# Patient Record
Sex: Male | Born: 1990 | Race: Black or African American | Hispanic: No | Marital: Single | Smoking: Former smoker
Health system: Southern US, Community
[De-identification: ages and names within clinical notes are randomized; demographics above are authoritative.]

## PROBLEM LIST (undated history)

## (undated) DIAGNOSIS — I429 Cardiomyopathy, unspecified: Secondary | ICD-10-CM

## (undated) DIAGNOSIS — N186 End stage renal disease: Secondary | ICD-10-CM

## (undated) DIAGNOSIS — I10 Essential (primary) hypertension: Secondary | ICD-10-CM

## (undated) HISTORY — DX: Essential (primary) hypertension: I10

## (undated) HISTORY — DX: End stage renal disease: N18.6

## (undated) HISTORY — DX: Cardiomyopathy, unspecified: I42.9

---
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
## 2008-03-03 ENCOUNTER — Emergency Department (HOSPITAL_COMMUNITY): Admission: EM | Admit: 2008-03-03 | Discharge: 2008-03-03 | Payer: Self-pay | Admitting: Emergency Medicine

## 2009-11-18 ENCOUNTER — Emergency Department (HOSPITAL_COMMUNITY): Admission: EM | Admit: 2009-11-18 | Discharge: 2009-11-18 | Payer: Self-pay | Admitting: Emergency Medicine

## 2011-01-22 LAB — CBC
HCT: 41.8 % (ref 39.0–52.0)
Hemoglobin: 13.8 g/dL (ref 13.0–17.0)
MCHC: 33 g/dL (ref 30.0–36.0)
MCV: 85.7 fL (ref 78.0–100.0)
Platelets: 144 10*3/uL — ABNORMAL LOW (ref 150–400)
RBC: 4.88 MIL/uL (ref 4.22–5.81)
RDW: 12.9 % (ref 11.5–15.5)
WBC: 10.6 10*3/uL — ABNORMAL HIGH (ref 4.0–10.5)

## 2011-01-22 LAB — DIFFERENTIAL
Basophils Absolute: 0 10*3/uL (ref 0.0–0.1)
Basophils Relative: 0 % (ref 0–1)
Eosinophils Absolute: 0.1 10*3/uL (ref 0.0–0.7)
Eosinophils Relative: 1 % (ref 0–5)
Lymphocytes Relative: 11 % — ABNORMAL LOW (ref 12–46)
Lymphs Abs: 1.2 10*3/uL (ref 0.7–4.0)
Monocytes Absolute: 0.8 10*3/uL (ref 0.1–1.0)
Monocytes Relative: 8 % (ref 3–12)
Neutro Abs: 8.4 10*3/uL — ABNORMAL HIGH (ref 1.7–7.7)
Neutrophils Relative %: 80 % — ABNORMAL HIGH (ref 43–77)

## 2011-01-22 LAB — SEDIMENTATION RATE: Sed Rate: 33 mm/hr — ABNORMAL HIGH (ref 0–16)

## 2018-07-26 ENCOUNTER — Inpatient Hospital Stay (HOSPITAL_COMMUNITY): Payer: Medicaid Other

## 2018-07-26 ENCOUNTER — Emergency Department (HOSPITAL_COMMUNITY): Payer: Medicaid Other

## 2018-07-26 ENCOUNTER — Inpatient Hospital Stay (HOSPITAL_COMMUNITY)
Admission: EM | Admit: 2018-07-26 | Discharge: 2018-08-06 | DRG: 304 | Disposition: A | Discharge status: Home / Self Care | Payer: Medicaid Other | Attending: Internal Medicine | Admitting: Internal Medicine

## 2018-07-26 ENCOUNTER — Encounter (HOSPITAL_COMMUNITY): Payer: Self-pay | Admitting: Emergency Medicine

## 2018-07-26 ENCOUNTER — Other Ambulatory Visit: Payer: Self-pay

## 2018-07-26 DIAGNOSIS — I161 Hypertensive emergency: Secondary | ICD-10-CM | POA: Diagnosis present

## 2018-07-26 DIAGNOSIS — R0602 Shortness of breath: Secondary | ICD-10-CM | POA: Diagnosis present

## 2018-07-26 DIAGNOSIS — R0609 Other forms of dyspnea: Secondary | ICD-10-CM

## 2018-07-26 DIAGNOSIS — N186 End stage renal disease: Secondary | ICD-10-CM

## 2018-07-26 DIAGNOSIS — D631 Anemia in chronic kidney disease: Secondary | ICD-10-CM | POA: Diagnosis present

## 2018-07-26 DIAGNOSIS — E8889 Other specified metabolic disorders: Secondary | ICD-10-CM | POA: Diagnosis present

## 2018-07-26 DIAGNOSIS — R072 Precordial pain: Secondary | ICD-10-CM

## 2018-07-26 DIAGNOSIS — D509 Iron deficiency anemia, unspecified: Secondary | ICD-10-CM | POA: Diagnosis present

## 2018-07-26 DIAGNOSIS — Z992 Dependence on renal dialysis: Secondary | ICD-10-CM

## 2018-07-26 DIAGNOSIS — I5021 Acute systolic (congestive) heart failure: Secondary | ICD-10-CM | POA: Diagnosis present

## 2018-07-26 DIAGNOSIS — I351 Nonrheumatic aortic (valve) insufficiency: Secondary | ICD-10-CM

## 2018-07-26 DIAGNOSIS — J9601 Acute respiratory failure with hypoxia: Secondary | ICD-10-CM | POA: Diagnosis present

## 2018-07-26 DIAGNOSIS — N189 Chronic kidney disease, unspecified: Secondary | ICD-10-CM | POA: Diagnosis present

## 2018-07-26 DIAGNOSIS — F17210 Nicotine dependence, cigarettes, uncomplicated: Secondary | ICD-10-CM | POA: Diagnosis present

## 2018-07-26 DIAGNOSIS — I160 Hypertensive urgency: Secondary | ICD-10-CM | POA: Diagnosis present

## 2018-07-26 DIAGNOSIS — R0600 Dyspnea, unspecified: Secondary | ICD-10-CM | POA: Diagnosis present

## 2018-07-26 DIAGNOSIS — E790 Hyperuricemia without signs of inflammatory arthritis and tophaceous disease: Secondary | ICD-10-CM | POA: Diagnosis present

## 2018-07-26 DIAGNOSIS — I43 Cardiomyopathy in diseases classified elsewhere: Secondary | ICD-10-CM | POA: Diagnosis present

## 2018-07-26 DIAGNOSIS — I340 Nonrheumatic mitral (valve) insufficiency: Secondary | ICD-10-CM

## 2018-07-26 DIAGNOSIS — N179 Acute kidney failure, unspecified: Secondary | ICD-10-CM | POA: Diagnosis present

## 2018-07-26 DIAGNOSIS — I509 Heart failure, unspecified: Secondary | ICD-10-CM

## 2018-07-26 DIAGNOSIS — N281 Cyst of kidney, acquired: Secondary | ICD-10-CM | POA: Diagnosis present

## 2018-07-26 DIAGNOSIS — Z8249 Family history of ischemic heart disease and other diseases of the circulatory system: Secondary | ICD-10-CM

## 2018-07-26 DIAGNOSIS — E876 Hypokalemia: Secondary | ICD-10-CM | POA: Diagnosis present

## 2018-07-26 DIAGNOSIS — N185 Chronic kidney disease, stage 5: Secondary | ICD-10-CM

## 2018-07-26 DIAGNOSIS — Z9114 Patient's other noncompliance with medication regimen: Secondary | ICD-10-CM

## 2018-07-26 DIAGNOSIS — I132 Hypertensive heart and chronic kidney disease with heart failure and with stage 5 chronic kidney disease, or end stage renal disease: Secondary | ICD-10-CM | POA: Diagnosis present

## 2018-07-26 DIAGNOSIS — N19 Unspecified kidney failure: Secondary | ICD-10-CM

## 2018-07-26 DIAGNOSIS — I10 Essential (primary) hypertension: Secondary | ICD-10-CM

## 2018-07-26 LAB — URINALYSIS, ROUTINE W REFLEX MICROSCOPIC
Bilirubin Urine: NEGATIVE
GLUCOSE, UA: NEGATIVE mg/dL
KETONES UR: NEGATIVE mg/dL
LEUKOCYTES UA: NEGATIVE
Nitrite: NEGATIVE
PROTEIN: 100 mg/dL — AB
Specific Gravity, Urine: 1.01 (ref 1.005–1.030)
pH: 5 (ref 5.0–8.0)

## 2018-07-26 LAB — COMPREHENSIVE METABOLIC PANEL
ALBUMIN: 3.8 g/dL (ref 3.5–5.0)
ALT: 30 U/L (ref 0–44)
ANION GAP: 16 — AB (ref 5–15)
AST: 25 U/L (ref 15–41)
Alkaline Phosphatase: 79 U/L (ref 38–126)
BUN: 73 mg/dL — AB (ref 6–20)
CHLORIDE: 92 mmol/L — AB (ref 98–111)
CO2: 28 mmol/L (ref 22–32)
Calcium: 9.2 mg/dL (ref 8.9–10.3)
Creatinine, Ser: 9.55 mg/dL — ABNORMAL HIGH (ref 0.61–1.24)
GFR calc Af Amer: 8 mL/min — ABNORMAL LOW (ref >60)
GFR calc non Af Amer: 7 mL/min — ABNORMAL LOW (ref >60)
Glucose, Bld: 119 mg/dL — ABNORMAL HIGH (ref 70–99)
POTASSIUM: 2.6 mmol/L — AB (ref 3.5–5.1)
SODIUM: 136 mmol/L (ref 135–145)
Total Bilirubin: 1.7 mg/dL — ABNORMAL HIGH (ref 0.3–1.2)
Total Protein: 7.4 g/dL (ref 6.5–8.1)

## 2018-07-26 LAB — RAPID URINE DRUG SCREEN, HOSP PERFORMED
Amphetamines: NOT DETECTED
Barbiturates: NOT DETECTED
Benzodiazepines: NOT DETECTED
Cocaine: NOT DETECTED
OPIATES: NOT DETECTED
TETRAHYDROCANNABINOL: POSITIVE — AB

## 2018-07-26 LAB — CBC WITH DIFFERENTIAL/PLATELET
BASOS PCT: 1 %
Basophils Absolute: 0.1 10*3/uL (ref 0.0–0.1)
Eosinophils Absolute: 0.1 10*3/uL (ref 0.0–0.7)
Eosinophils Relative: 1 %
HEMATOCRIT: 33.9 % — AB (ref 39.0–52.0)
HEMOGLOBIN: 11.2 g/dL — AB (ref 13.0–17.0)
LYMPHS ABS: 1.4 10*3/uL (ref 0.7–4.0)
LYMPHS PCT: 11 %
MCH: 28.6 pg (ref 26.0–34.0)
MCHC: 33 g/dL (ref 30.0–36.0)
MCV: 86.7 fL (ref 78.0–100.0)
MONOS PCT: 6 %
Monocytes Absolute: 0.7 10*3/uL (ref 0.1–1.0)
NEUTROS ABS: 10.4 10*3/uL — AB (ref 1.7–7.7)
NEUTROS PCT: 81 %
Platelets: 151 10*3/uL (ref 150–400)
RBC: 3.91 MIL/uL — ABNORMAL LOW (ref 4.22–5.81)
RDW: 16.7 % — ABNORMAL HIGH (ref 11.5–15.5)
WBC: 12.8 10*3/uL — ABNORMAL HIGH (ref 4.0–10.5)

## 2018-07-26 LAB — NA AND K (SODIUM & POTASSIUM), RAND UR
POTASSIUM UR: 23 mmol/L
Sodium, Ur: 19 mmol/L

## 2018-07-26 LAB — CREATININE, URINE, RANDOM: Creatinine, Urine: 130.17 mg/dL

## 2018-07-26 LAB — D-DIMER, QUANTITATIVE: D-Dimer, Quant: 1.41 ug/mL-FEU — ABNORMAL HIGH (ref 0.00–0.50)

## 2018-07-26 LAB — I-STAT TROPONIN, ED: Troponin i, poc: 0.34 ng/mL (ref 0.00–0.08)

## 2018-07-26 LAB — TROPONIN I: Troponin I: 0.25 ng/mL (ref <0.03)

## 2018-07-26 LAB — BRAIN NATRIURETIC PEPTIDE: B Natriuretic Peptide: 2260 pg/mL — ABNORMAL HIGH (ref 0.0–100.0)

## 2018-07-26 LAB — ECHOCARDIOGRAM COMPLETE

## 2018-07-26 MED ORDER — ASPIRIN 81 MG PO CHEW
324.0000 mg | CHEWABLE_TABLET | Freq: Once | ORAL | Status: AC
  Administered 2018-07-26: 324 mg via ORAL

## 2018-07-26 MED ORDER — TRAZODONE HCL 50 MG PO TABS
50.0000 mg | ORAL_TABLET | Freq: Every evening | ORAL | Status: DC | PRN
  Administered 2018-07-26 – 2018-07-28 (×2): 50 mg via ORAL
  Filled 2018-07-26 (×3): qty 1

## 2018-07-26 MED ORDER — LABETALOL HCL 5 MG/ML IV SOLN: 10.0000 mg | INTRAVENOUS | Status: DC | PRN

## 2018-07-26 MED ORDER — ONDANSETRON HCL 4 MG PO TABS: 4.0000 mg | ORAL_TABLET | Freq: Four times a day (QID) | ORAL | Status: DC | PRN

## 2018-07-26 MED ORDER — ASPIRIN 81 MG PO CHEW
CHEWABLE_TABLET | ORAL | Status: AC
  Filled 2018-07-26: qty 4

## 2018-07-26 MED ORDER — FUROSEMIDE 10 MG/ML IJ SOLN
40.0000 mg | Freq: Two times a day (BID) | INTRAMUSCULAR | Status: DC
  Administered 2018-07-26 – 2018-07-27 (×2): 40 mg via INTRAVENOUS
  Filled 2018-07-26 (×2): qty 4

## 2018-07-26 MED ORDER — SODIUM CHLORIDE 0.9% FLUSH: 3.0000 mL | INTRAVENOUS | Status: DC | PRN

## 2018-07-26 MED ORDER — ALBUTEROL SULFATE (2.5 MG/3ML) 0.083% IN NEBU: 2.5000 mg | INHALATION_SOLUTION | RESPIRATORY_TRACT | Status: DC | PRN

## 2018-07-26 MED ORDER — ACETAMINOPHEN 325 MG PO TABS
650.0000 mg | ORAL_TABLET | Freq: Four times a day (QID) | ORAL | Status: DC | PRN
  Administered 2018-07-26 – 2018-07-28 (×4): 650 mg via ORAL
  Filled 2018-07-26 (×6): qty 2

## 2018-07-26 MED ORDER — POTASSIUM CHLORIDE 10 MEQ/100ML IV SOLN
10.0000 meq | INTRAVENOUS | Status: AC
  Administered 2018-07-26 (×4): 10 meq via INTRAVENOUS
  Filled 2018-07-26 (×3): qty 100

## 2018-07-26 MED ORDER — INFLUENZA VAC SPLIT QUAD 0.5 ML IM SUSY
0.5000 mL | PREFILLED_SYRINGE | INTRAMUSCULAR | Status: DC
  Filled 2018-07-26 (×2): qty 0.5

## 2018-07-26 MED ORDER — SODIUM CHLORIDE 0.9 % IV SOLN: 250.0000 mL | INTRAVENOUS | Status: DC | PRN

## 2018-07-26 MED ORDER — AMLODIPINE BESYLATE 5 MG PO TABS
ORAL_TABLET | ORAL | Status: AC
  Filled 2018-07-26: qty 2

## 2018-07-26 MED ORDER — ONDANSETRON HCL 4 MG/2ML IJ SOLN
4.0000 mg | Freq: Four times a day (QID) | INTRAMUSCULAR | Status: DC | PRN
  Administered 2018-07-26 – 2018-07-28 (×2): 4 mg via INTRAVENOUS
  Filled 2018-07-26 (×2): qty 2

## 2018-07-26 MED ORDER — AMLODIPINE BESYLATE 5 MG PO TABS
10.0000 mg | ORAL_TABLET | Freq: Every day | ORAL | Status: DC
  Administered 2018-07-26 – 2018-08-06 (×11): 10 mg via ORAL
  Filled 2018-07-26 (×11): qty 2

## 2018-07-26 MED ORDER — POTASSIUM CHLORIDE CRYS ER 20 MEQ PO TBCR: 40.0000 meq | EXTENDED_RELEASE_TABLET | Freq: Once | ORAL | Status: DC

## 2018-07-26 MED ORDER — LABETALOL HCL 5 MG/ML IV SOLN
10.0000 mg | Freq: Once | INTRAVENOUS | Status: AC
  Administered 2018-07-26: 10 mg via INTRAVENOUS
  Filled 2018-07-26: qty 4

## 2018-07-26 MED ORDER — SODIUM CHLORIDE 0.9% FLUSH
3.0000 mL | Freq: Two times a day (BID) | INTRAVENOUS | Status: DC
  Administered 2018-07-26 – 2018-08-06 (×19): 3 mL via INTRAVENOUS

## 2018-07-26 MED ORDER — ACETAMINOPHEN 650 MG RE SUPP: 650.0000 mg | Freq: Four times a day (QID) | RECTAL | Status: DC | PRN

## 2018-07-26 MED ORDER — TECHNETIUM TO 99M ALBUMIN AGGREGATED
4.0000 | Freq: Once | INTRAVENOUS | Status: AC | PRN
  Administered 2018-07-26: 4.4 via INTRAVENOUS

## 2018-07-26 MED ORDER — LABETALOL HCL 5 MG/ML IV SOLN
10.0000 mg | INTRAVENOUS | Status: DC | PRN
  Administered 2018-07-26 – 2018-07-27 (×2): 10 mg via INTRAVENOUS
  Filled 2018-07-26 (×2): qty 4

## 2018-07-26 MED ORDER — ISOSORBIDE MONONITRATE ER 60 MG PO TB24
30.0000 mg | ORAL_TABLET | Freq: Every day | ORAL | Status: DC
  Administered 2018-07-26: 30 mg via ORAL
  Filled 2018-07-26 (×6): qty 1

## 2018-07-26 MED ORDER — POTASSIUM CHLORIDE IN NACL 20-0.45 MEQ/L-% IV SOLN
INTRAVENOUS | Status: DC
  Administered 2018-07-26 – 2018-07-28 (×5): via INTRAVENOUS
  Filled 2018-07-26 (×8): qty 1000

## 2018-07-26 MED ORDER — HEPARIN SODIUM (PORCINE) 5000 UNIT/ML IJ SOLN
5000.0000 [IU] | Freq: Three times a day (TID) | INTRAMUSCULAR | Status: AC
  Administered 2018-07-26 – 2018-07-29 (×11): 5000 [IU] via SUBCUTANEOUS
  Filled 2018-07-26 (×11): qty 1

## 2018-07-26 MED ORDER — POLYETHYLENE GLYCOL 3350 17 G PO PACK: 17.0000 g | PACK | Freq: Every day | ORAL | Status: DC | PRN

## 2018-07-26 MED ORDER — TECHNETIUM TC 99M DIETHYLENETRIAME-PENTAACETIC ACID
30.0000 | Freq: Once | INTRAVENOUS | Status: AC | PRN
  Administered 2018-07-26: 33 via RESPIRATORY_TRACT

## 2018-07-26 MED ORDER — HYDRALAZINE HCL 25 MG PO TABS
50.0000 mg | ORAL_TABLET | Freq: Three times a day (TID) | ORAL | Status: DC
  Administered 2018-07-26 (×2): 50 mg via ORAL
  Filled 2018-07-26 (×3): qty 2

## 2018-07-26 NOTE — ED Notes (Signed)
Pt was informed that we need a urine sample. Pt states that he can not urinate at this time. 

## 2018-07-26 NOTE — Progress Notes (Signed)
Lab called with critical value Troponin 0.25. Doctor notified awaiting additional orders, if any.

## 2018-07-26 NOTE — ED Notes (Signed)
02 2l [place] applied. sats from 88-94

## 2018-07-26 NOTE — ED Triage Notes (Signed)
Pt states walked about 40 feet today and became sob with sharp pain to epigastric area. Denied any n/v/d/cp or sweating. States did have sharp pain to mid chest yesterday. Nad at present. No complaints at this time per pt.

## 2018-07-26 NOTE — Consult Note (Signed)
Reason for Consult: Renal failure and malignant hypertension Referring Physician: Dr. Meriam Sprague Edward Thomas is an 27 y.o. male.  HPI: He is a patient who has history of hypertension for since 2004 presently came to the emergency room with complaints of chest pain, difficulty breathing and some orthopnea the last couple of days.  Difficulty breathing and exertional dyspnea has been there for some time.  However the chest pain is new.  Patient also has cough but no sputum production.  He states that he has some hemoptysis since yesterday.  Patient denies any nausea or vomiting.  Appetite is good and no diarrhea.  Patient states that he has been on antihypertensive medication for a short.  However since he did not get any refill he was not taking any medication the last couple of years.  Patient denies any previous history of renal failure or kidney stone.  Patient also denies any use of NSAIDS  Past Medical History:  Diagnosis Date  . Heart problem    pt does not remember, was in high school    History reviewed. No pertinent surgical history.  History reviewed. No pertinent family history.  Social History:  reports that he has been smoking. He has never used smokeless tobacco. He reports that he does not drink alcohol or use drugs.  Allergies: No Known Allergies  Medications: I have reviewed the patient's current medications.  Results for orders placed or performed during the hospital encounter of 07/26/18 (from the past 48 hour(s))  Comprehensive metabolic panel     Status: Abnormal   Collection Time: 07/26/18  9:58 AM  Result Value Ref Range   Sodium 136 135 - 145 mmol/L   Potassium 2.6 (LL) 3.5 - 5.1 mmol/L    Comment: CRITICAL RESULT CALLED TO, READ BACK BY AND VERIFIED WITH: EDWARDS,C AT 10:30AM ON 07/26/18 BY FESTERMAN,C    Chloride 92 (L) 98 - 111 mmol/L   CO2 28 22 - 32 mmol/L   Glucose, Bld 119 (H) 70 - 99 mg/dL   BUN 73 (H) 6 - 20 mg/dL   Creatinine, Ser 9.55 (H) 0.61 -  1.24 mg/dL   Calcium 9.2 8.9 - 10.3 mg/dL   Total Protein 7.4 6.5 - 8.1 g/dL   Albumin 3.8 3.5 - 5.0 g/dL   AST 25 15 - 41 U/L   ALT 30 0 - 44 U/L    Comment: RESULTS CONFIRMED BY MANUAL DILUTION   Alkaline Phosphatase 79 38 - 126 U/L   Total Bilirubin 1.7 (H) 0.3 - 1.2 mg/dL   GFR calc non Af Amer 7 (L) >60 mL/min   GFR calc Af Amer 8 (L) >60 mL/min    Comment: (NOTE) The eGFR has been calculated using the CKD EPI equation. This calculation has not been validated in all clinical situations. eGFR's persistently <60 mL/min signify possible Chronic Kidney Disease.    Anion gap 16 (H) 5 - 15    Comment: Performed at [facility], [address] [place], [place] [address]  Brain natriuretic peptide     Status: Abnormal   Collection Time: 07/26/18  9:58 AM  Result Value Ref Range   B Natriuretic Peptide 2,260.0 (H) 0.0 - 100.0 pg/mL    Comment: Performed at [facility], [address], [place], [place] [address]  CBC with Differential     Status: Abnormal   Collection Time: 07/26/18  9:58 AM  Result Value Ref Range   WBC 12.8 (H) 4.0 - 10.5 K/uL   RBC  3.91 (L) 4.22 - 5.81 MIL/uL   Hemoglobin 11.2 (L) 13.0 - 17.0 g/dL   HCT 33.9 (L) 39.0 - 52.0 %   MCV 86.7 78.0 - 100.0 fL   MCH 28.6 26.0 - 34.0 pg   MCHC 33.0 30.0 - 36.0 g/dL   RDW 16.7 (H) 11.5 - 15.5 %   Platelets 151 150 - 400 K/uL    Comment: SPECIMEN CHECKED FOR CLOTS PLATELET COUNT CONFIRMED BY SMEAR    Neutrophils Relative % 81 %   Neutro Abs 10.4 (H) 1.7 - 7.7 K/uL   Lymphocytes Relative 11 %   Lymphs Abs 1.4 0.7 - 4.0 K/uL   Monocytes Relative 6 %   Monocytes Absolute 0.7 0.1 - 1.0 K/uL   Eosinophils Relative 1 %   Eosinophils Absolute 0.1 0.0 - 0.7 K/uL   Basophils Relative 1 %   Basophils Absolute 0.1 0.0 - 0.1 K/uL    Comment: Performed at [facility], [address] [place], [place] [address]  D-dimer, quantitative     Status: Abnormal   Collection Time: 07/26/18  9:58 AM  Result Value Ref Range    D-Dimer, Quant 1.41 (H) 0.00 - 0.50 ug/mL-FEU    Comment: (NOTE) At the manufacturer cut-off of 0.50 ug/mL FEU, this assay has been documented to exclude PE with a sensitivity and negative predictive value of 97 to 99%.  At this time, this assay has not been approved by the FDA to exclude DVT/VTE. Results should be correlated with clinical presentation. Performed at [facility], [address], [place],  [address]   I-stat troponin, ED     Status: Abnormal   Collection Time: 07/26/18 10:00 AM  Result Value Ref Range   Troponin i, poc 0.34 (HH) 0.00 - 0.08 ng/mL   Comment 3            Comment: Due to the release kinetics of cTnI, a negative result within the first hours of the onset of symptoms does not rule out myocardial infarction with certainty. If myocardial infarction is still suspected, repeat the test at appropriate intervals.     Dg Chest 2 View  Result Date: 07/26/2018 CLINICAL DATA:  Shortness of breath, hemoptysis. EXAM: CHEST - 2 VIEW COMPARISON:  None. FINDINGS: Mild cardiomegaly is noted with central pulmonary vascular congestion. Probable mild bilateral pulmonary edema is noted. No pneumothorax or pleural effusion is noted. Bony thorax is unremarkable. IMPRESSION: Mild cardiomegaly with central pulmonary vascular congestion and probable mild bilateral pulmonary edema. Electronically Signed   By: Marijo Conception, M.D.   On: 07/26/2018 10:11    Review of Systems  Constitutional: Negative for chills, fever and malaise/fatigue.  Respiratory: Positive for cough, hemoptysis and shortness of breath.   Cardiovascular: Positive for chest pain. Negative for orthopnea and leg swelling.       Exertional dyspnea  Gastrointestinal: Negative for diarrhea, nausea and vomiting.  Genitourinary: Positive for frequency.       Nocturia  Musculoskeletal: Negative for myalgias.  Neurological: Negative for dizziness and headaches.  Endo/Heme/Allergies: Negative for polydipsia.    Blood pressure (!) 203/142, pulse (!) 103, resp. rate (!) 36, SpO2 95 %. Physical Exam  Constitutional: He is oriented to person, place, and time. No distress.  Neck: No JVD present.  Cardiovascular: Normal rate and regular rhythm.  Respiratory: No respiratory distress. He has no wheezes. He has no rales.  GI: He exhibits no distension. There is no tenderness.  Musculoskeletal: He exhibits no edema.  Neurological: He  is alert and oriented to person, place, and time.    Assessment/Plan: 1] renal failure: Acute versus chronic.  Presently his creatinine is significantly elevated but patient seems to be asymptomatic.  Etiology could be secondary to malignant hypertension however other primary and secondary cause of renal failure at this moment cannot be ruled out.  Patient also is not able to give any urine hence urinalysis is not available. 2] malignant hypertension: Patient with history of hypertension for more than 15 years and was taking hydrochlorothiazide at one time.  But the patient was not taking any antihypertensive medication for the last couple of years.  Etiology could be primary hypertension however with the presence of hypokalemia and possibly metabolic alkalosis [CO2 is 28] secondary hypertension such as primary hyperaldosteronism cannot be ruled out 3] difficulty breathing and some exertional dyspnea: This could be secondary to flash pulmonary edema related to his high blood pressure.  Patient with some cardiomegaly but no significant sign of fluid overload.  Patient also does not have any edema. 4] hypokalemia: Etiology at this moment not clear.  Previously patient was on hydrochlorothiazide but he states that he is not taking any medication for the last couple of years.  With elevated CO2 need to rule out malignant hypertension. 5] anemia 6] cough: Nonproductive.  Patient not febrile.  His white blood cell count is slightly high Plan: We will check urine sodium, potassium,  creatinine 2] we will check ANA, complement, hepatitis B surface antigen, hepatitis C antibody 3] we will check urine protein to creatinine ratio 4] we will start patient on half-normal saline with 40 mEq of KCl at 75 cc/h 5] we will use Lasix to improve his urine output 6] we will do ultrasound of the kidneys 7] we will check his renal panel in the morning.   Genie Wenke S 07/26/2018, 11:58 AM

## 2018-07-26 NOTE — Progress Notes (Signed)
Patient had oxygen off stated to people in room he would place back on when he went to bed. Stated he had it off all day!

## 2018-07-26 NOTE — H&P (Signed)
Patient Demographics:    Edward Thomas, is a 27 y.o. male  MRN: [id number]   DOB - 04-12-1991  Admit Date - 07/26/2018  Outpatient Primary MD for the patient is Patient, No Pcp Per   Assessment & Plan:    Principal Problem:   Hypertensive urgency Active Problems:   Dyspnea   Acute CHF (congestive heart failure) ([place])   Acute kidney injury superimposed on CKD [facility])    1)Hypertensive Emergency--- on presentation to the ED blood pressure was 251/154,  patient was in heart failure, renal failure as well as respiratory failure with tachypnea and hypoxia, despite IV medications blood pressure remained elevated, patient admits to noncompliance with antihypertensives for long time, empirically start amlodipine 10 mg daily, hydralazine 50 mg 3 times daily,/isosorbide 30 mg daily,  may use IV labetalol when necessary  Every 4 hours for systolic blood pressure over 160 mmhg   2) acute congestive heart failure--- secondary to uncontrolled hypertension, ???  Diastolic versus Systolic, BNP was 4356, chest x-ray consistent with pulmonary edema, clinical exam consistent with heart failure, echo pending, IV diuretics as ordered Daily weight and fluid input and output monitoring, serial troponins and EKGs for ACS rule out, replace potassium, continue supplemental oxygen, VQ scan low probability for PE  3)AKI----acute kidney injury on CKD stage - ??? Baseline CKD stage    creatinine on admission= 9.55 with a BUN of 73,   ,   baseline creatinine = ??? Baseline    ,  renally adjust medications, avoid nephrotoxic agents/dehydration/hypotension, prior renal parameters not available, ultrasound suggest chronic kidney disease secondary to medical renal etiology without obstructive uropathy, suspect due to uncontrolled hypertension,  nephrology consult appreciated, further work-up including hepatitis profile, UA, complement series, ANA, ANCA and antistreptolysin titers are pending  4) acute hypoxic respiratory failure--- secondary to #1 and #2 above, give supplemental oxygen  5)Tobacco Abuse-  Smoking cessation counseling for 4 minutes today, consider nicotine patch   With History of - Reviewed by me  Past Medical History:  Diagnosis Date  . Heart problem    pt does not remember, was in high school      History reviewed. No pertinent surgical history.    Chief Complaint  Patient presents with  . Shortness of Breath      HPI:    Edward Thomas  is a 27 y.o. male with past medical history relevant for long-standing history of hypertension diagnosed as a teenager (2008) patient has not been compliant with antihypertensives for more than 8 years now presents with progressive dyspnea on exertion, orthopnea, paroxysmal nocturnal dyspnea over the last several days got worse over the last 24 hours.  No frank chest pain at this time, however patient admits to intermittent chest discomfort over the last week  No vomiting no diarrhea, no sick contacts at home,  Denies illicit drug use but admits to tobacco use denies excessive alcohol intake  In ED...  blood pressure  was 251/154, d-dimer was elevated but VQ scan was low for ability for PE  Clinical exam in the ED and chest x-ray as well as BMP were consistent with acute CHF,  It was found to have renal dysfunction with a creatinine of 9.5 and a BUN of 73, renal ultrasound failed to demonstrate obstructive uropathy it does show chronic medical renal disease most likely due to long-term uncontrolled hypertension  Patient received IV labetalol as well as Lasix in the ED with some improvement in BP and shortness of breath   Review of systems:    In addition to the HPI above,   A full Review of  Systems was done, all other systems reviewed are negative except as  noted above in HPI , .    Social History:  Reviewed by me    Social History   Tobacco Use  . Smoking status: Current Every Day Smoker  . Smokeless tobacco: Never Used  Substance Use Topics  . Alcohol use: Never    Frequency: Never     Family History :  Reviewed by me HTN   Home Medications:   Prior to Admission medications   Not on File     Allergies:    No Known Allergies   Physical Exam:   Vitals  Blood pressure (!) 173/120, pulse 99, resp. rate (!) 25, SpO2 (!) 89 %.  Physical Examination: General appearance - alert, well appearing, and in no distress and  Mental status - alert, oriented to person, place, and time,  Nose- [place] 2L/min Eyes - sclera anicteric Neck - supple, +ve JVD elevation , Chest -diminished in bases with bibasilar rales  heart - S1 and S2 normal, regular Abdomen - soft, nontender, nondistended, no masses or organomegaly, no CVA tenderness Neurological - screening mental status exam normal, neck supple without rigidity, cranial nerves II through XII intact, DTR's normal and symmetric Extremities - trace pedal edema noted, intact peripheral pulses  Skin - warm, dry     Data Review:    CBC Recent Labs  Lab 07/26/18 0958  WBC 12.8*  HGB 11.2*  HCT 33.9*  PLT 151  MCV 86.7  MCH 28.6  MCHC 33.0  RDW 16.7*  LYMPHSABS 1.4  MONOABS 0.7  EOSABS 0.1  BASOSABS 0.1   ------------------------------------------------------------------------------------------------------------------  Chemistries  Recent Labs  Lab 07/26/18 0958  NA 136  K 2.6*  CL 92*  CO2 28  GLUCOSE 119*  BUN 73*  CREATININE 9.55*  CALCIUM 9.2  AST 25  ALT 30  ALKPHOS 79  BILITOT 1.7*   ------------------------------------------------------------------------------------------------------------------ CrCl cannot be calculated (Unknown ideal  weight.). ------------------------------------------------------------------------------------------------------------------ No results for input(s): TSH, T4TOTAL, T3FREE, THYROIDAB in the last 72 hours.  Invalid input(s): FREET3   Coagulation profile No results for input(s): INR, PROTIME in the last 168 hours. ------------------------------------------------------------------------------------------------------------------- Recent Labs    07/26/18 0958  DDIMER 1.41*   -------------------------------------------------------------------------------------------------------------------  Cardiac Enzymes No results for input(s): CKMB, TROPONINI, MYOGLOBIN in the last 168 hours.  Invalid input(s): CK ------------------------------------------------------------------------------------------------------------------    Component Value Date/Time   BNP 2,260.0 (H) 07/26/2018 0958   ---------------------------------------------------------------------------------------------------------------  Urinalysis No results found for: COLORURINE, APPEARANCEUR, LABSPEC, PHURINE, GLUCOSEU, HGBUR, BILIRUBINUR, KETONESUR, PROTEINUR, UROBILINOGEN, NITRITE, LEUKOCYTESUR  ----------------------------------------------------------------------------------------------------------------   Imaging Results:    Dg Chest 2 View  Result Date: 07/26/2018 CLINICAL DATA:  Shortness of breath, hemoptysis. EXAM: CHEST - 2 VIEW COMPARISON:  None. FINDINGS: Mild cardiomegaly is noted with central pulmonary vascular congestion. Probable mild bilateral pulmonary edema is noted.  No pneumothorax or pleural effusion is noted. Bony thorax is unremarkable. IMPRESSION: Mild cardiomegaly with central pulmonary vascular congestion and probable mild bilateral pulmonary edema. Electronically Signed   By: Marijo Conception, M.D.   On: 07/26/2018 10:11   US Renal  Result Date: 07/26/2018 CLINICAL DATA:  Renal failure. EXAM: RENAL /  URINARY TRACT ULTRASOUND COMPLETE COMPARISON:  07/26/2018. FINDINGS: Right Kidney: Length: 10.9 cm. Increased echogenicity. 1.7 cm hyperechoic shadowing density noted the midportion of the right kidney. This could be a calcified renal mass. Large renal stone could also present this fashion. No hydronephrosis visualized. Left Kidney: Length: 9.4 cm. Increased echogenicity. 1.9 cm complex cystic versus solid mass noted the midportion of the left kidney. No hydronephrosis visualized. Bladder: Appears normal for degree of bladder distention. IMPRESSION: 1. Increased echogenicity both kidneys consistent with chronic medical renal disease. 2. 1.7 cm hyperechoic shadowing density in the midportion of the right kidney. This could represent a calcified renal mass. Large renal stone could present in this fashion. 1.9 cm complex cystic versus solid mass in the midportion of the left kidney. A renal tumor in either kidney cannot be excluded. For further evaluation MRI of the kidneys suggested. Electronically Signed   By: Marcello Moores  Register   On: 07/26/2018 13:57   Nm Pulmonary Vent And Perf (v/q Scan)  Result Date: 07/26/2018 CLINICAL DATA:  Shortness of Breath EXAM: NUCLEAR MEDICINE VENTILATION - PERFUSION LUNG SCAN VIEWS: Anterior, posterior, left lateral, right lateral, RPO, LPO, RAO, LAO-ventilation and perfusion RADIOPHARMACEUTICALS:  33.0 mCi of Tc-1m DTPA aerosol inhalation and 4.4 mCi Tc48m-MAA IV COMPARISON:  Chest radiograph July 26, 2018 FINDINGS: Ventilation: Radiotracer uptake is homogeneous and symmetric bilaterally. No ventilation defects are evident. Perfusion: Radiotracer uptake is homogeneous and symmetric bilaterally. No perfusion defects are evident. IMPRESSION: No ventilation or perfusion defects. Very low probability of pulmonary embolus. Electronically Signed   By: Lowella Grip III M.D.   On: 07/26/2018 13:33    Radiological Exams on Admission: Dg Chest 2 View  Result Date:  07/26/2018 CLINICAL DATA:  Shortness of breath, hemoptysis. EXAM: CHEST - 2 VIEW COMPARISON:  None. FINDINGS: Mild cardiomegaly is noted with central pulmonary vascular congestion. Probable mild bilateral pulmonary edema is noted. No pneumothorax or pleural effusion is noted. Bony thorax is unremarkable. IMPRESSION: Mild cardiomegaly with central pulmonary vascular congestion and probable mild bilateral pulmonary edema. Electronically Signed   By: Marijo Conception, M.D.   On: 07/26/2018 10:11   US Renal  Result Date: 07/26/2018 CLINICAL DATA:  Renal failure. EXAM: RENAL / URINARY TRACT ULTRASOUND COMPLETE COMPARISON:  07/26/2018. FINDINGS: Right Kidney: Length: 10.9 cm. Increased echogenicity. 1.7 cm hyperechoic shadowing density noted the midportion of the right kidney. This could be a calcified renal mass. Large renal stone could also present this fashion. No hydronephrosis visualized. Left Kidney: Length: 9.4 cm. Increased echogenicity. 1.9 cm complex cystic versus solid mass noted the midportion of the left kidney. No hydronephrosis visualized. Bladder: Appears normal for degree of bladder distention. IMPRESSION: 1. Increased echogenicity both kidneys consistent with chronic medical renal disease. 2. 1.7 cm hyperechoic shadowing density in the midportion of the right kidney. This could represent a calcified renal mass. Large renal stone could present in this fashion. 1.9 cm complex cystic versus solid mass in the midportion of the left kidney. A renal tumor in either kidney cannot be excluded. For further evaluation MRI of the kidneys suggested. Electronically Signed   By: Marcello Moores  Register   On: 07/26/2018 13:57  Nm Pulmonary Vent And Perf (v/q Scan)  Result Date: 07/26/2018 CLINICAL DATA:  Shortness of Breath EXAM: NUCLEAR MEDICINE VENTILATION - PERFUSION LUNG SCAN VIEWS: Anterior, posterior, left lateral, right lateral, RPO, LPO, RAO, LAO-ventilation and perfusion RADIOPHARMACEUTICALS:  33.0 mCi of  Tc-70m DTPA aerosol inhalation and 4.4 mCi Tc78m-MAA IV COMPARISON:  Chest radiograph July 26, 2018 FINDINGS: Ventilation: Radiotracer uptake is homogeneous and symmetric bilaterally. No ventilation defects are evident. Perfusion: Radiotracer uptake is homogeneous and symmetric bilaterally. No perfusion defects are evident. IMPRESSION: No ventilation or perfusion defects. Very low probability of pulmonary embolus. Electronically Signed   By: Lowella Grip III M.D.   On: 07/26/2018 13:33    DVT Prophylaxis -SCD/Heparin  AM Labs Ordered, also please review Full Orders  Family Communication: Admission, patients condition and plan of care including tests being ordered have been discussed with the patient  who indicate understanding and agree with the plan   Code Status - Full Code  Likely DC to  home  Condition   Stable  Roxan Hockey M.D on 07/26/2018 at 4:50 PM Pager---[phone] Go to [email or web address] - [email or web address] for contact info  [facility] - Office  [phone]

## 2018-07-26 NOTE — ED Notes (Signed)
Pt states he has a smokers cough and has been spitting up blood last 2 days. edp in wit pt now

## 2018-07-26 NOTE — Progress Notes (Signed)
*  PRELIMINARY RESULTS* Echocardiogram 2D Echocardiogram has been performed.  Edward Thomas 07/26/2018, 3:21 PM

## 2018-07-26 NOTE — Progress Notes (Deleted)
Report called to Lorriane Shire on 6E at [facility].  Patient transported by [facility] about 45 minutes ago   c

## 2018-07-26 NOTE — ED Notes (Addendum)
CRITICAL VALUE ALERT  Critical Value:  Potassium 2.6/troponin 0.36/d dimer 1.41  Date & Time Notied:  07/26/18  Provider Notified: dr long  Orders Received/Actions taken:

## 2018-07-26 NOTE — ED Notes (Signed)
Labored breathing noted. Pt denies any sob

## 2018-07-26 NOTE — ED Notes (Signed)
Pts mother's cell phone is [phone] for updates

## 2018-07-26 NOTE — ED Provider Notes (Signed)
Emergency Department Provider Note   I have reviewed the triage vital signs and the nursing notes.   HISTORY  Chief Complaint Shortness of Breath   HPI Edward Thomas is a 27 y.o. male with PMH of HTN off meds since 2008 presents to the emergency department for evaluation of intermittent chest pain and dyspnea worsening over the past 2 days.  Patient does smoke cigarettes but states he quit 2 days ago when symptoms began.  He describes an intermittent, sharp, substernal chest pain with no obvious provoking or modifying factors.  No radiation of pain.  He is not currently having chest pain but is noticed significant dyspnea worsening over the last 2 days.  His dyspnea is worse with exertion and he has had a cough which is nonproductive but sometimes will spit out some bloody sputum.  No history of DVT/PE.  He denies noticing any lower extremity swelling.  Denies abdominal pain or back pain.  No unilateral weakness, numbness, vision changes, or speech changes.   Past Medical History:  Diagnosis Date  . Heart problem    pt does not remember, was in high school    Patient Active Problem List   Diagnosis Date Noted  . Hypertensive urgency 07/26/2018    History reviewed. No pertinent surgical history.    Allergies Patient has no known allergies.  History reviewed. No pertinent family history.  Social History Social History   Tobacco Use  . Smoking status: Current Every Day Smoker  . Smokeless tobacco: Never Used  Substance Use Topics  . Alcohol use: Never    Frequency: Never  . Drug use: Never    Review of Systems  Constitutional: No fever/chills Eyes: No visual changes. ENT: No sore throat. Cardiovascular: Positive chest pain. Respiratory: Positive shortness of breath. Gastrointestinal: No abdominal pain.  No nausea, no vomiting.  No diarrhea.  No constipation. Genitourinary: Negative for dysuria. Musculoskeletal: Negative for back pain. Skin: Negative for  rash. Neurological: Negative for headaches, focal weakness or numbness.  10-point ROS otherwise negative.  ____________________________________________   PHYSICAL EXAM:  VITAL SIGNS: ED Triage Vitals  Enc Vitals Group     BP 07/26/18 0938 (!) 239/169     Pulse Rate 07/26/18 0941 (!) 118     Resp 07/26/18 0941 18     SpO2 07/26/18 0941 94 %     Pain Score 07/26/18 0938 0   Constitutional: Alert and oriented. Well appearing and in no acute distress. Eyes: Conjunctivae are normal. Head: Atraumatic. Nose: No congestion/rhinnorhea. Mouth/Throat: Mucous membranes are moist.  Neck: No stridor.  Cardiovascular: Tachycardia. Good peripheral circulation. Grossly normal heart sounds.   Respiratory: Increased respiratory effort.  No retractions. Lungs CTAB. No wheezing, rales, or rhonchi.  Gastrointestinal: Soft and nontender. No distention.  Musculoskeletal: No lower extremity tenderness nor edema. No gross deformities of extremities. Neurologic:  Normal speech and language. No gross focal neurologic deficits are appreciated.  Skin:  Skin is warm, dry and intact. No rash noted.  ____________________________________________   LABS (all labs ordered are listed, but only abnormal results are displayed)  Labs Reviewed  COMPREHENSIVE METABOLIC PANEL - Abnormal; Notable for the following components:      Result Value   Potassium 2.6 (*)    Chloride 92 (*)    Glucose, Bld 119 (*)    BUN 73 (*)    Creatinine, Ser 9.55 (*)    Total Bilirubin 1.7 (*)    GFR calc non Af Amer 7 (*)  GFR calc Af Amer 8 (*)    Anion gap 16 (*)    All other components within normal limits  BRAIN NATRIURETIC PEPTIDE - Abnormal; Notable for the following components:   B Natriuretic Peptide 2,260.0 (*)    All other components within normal limits  CBC WITH DIFFERENTIAL/PLATELET - Abnormal; Notable for the following components:   WBC 12.8 (*)    RBC 3.91 (*)    Hemoglobin 11.2 (*)    HCT 33.9 (*)    RDW  16.7 (*)    Neutro Abs 10.4 (*)    All other components within normal limits  D-DIMER, QUANTITATIVE (NOT AT [facility]) - Abnormal; Notable for the following components:   D-Dimer, Quant 1.41 (*)    All other components within normal limits  I-STAT TROPONIN, ED - Abnormal; Notable for the following components:   Troponin i, poc 0.34 (*)    All other components within normal limits  URINALYSIS, ROUTINE W REFLEX MICROSCOPIC  RAPID URINE DRUG SCREEN, HOSP PERFORMED  RAPID URINE DRUG SCREEN, HOSP PERFORMED  HIV ANTIBODY (ROUTINE TESTING W REFLEX)   ____________________________________________  EKG   EKG Interpretation  Date/Time:  Friday July 26 2018 09:49:47 EDT Ventricular Rate:  118 PR Interval:    QRS Duration: 105 QT Interval:  376 QTC Calculation: 527 R Axis:   74 Text Interpretation:  Sinus tachycardia Biatrial enlargement LVH with secondary repolarization abnormality Anterior ST elevation, probably due to LVH Prolonged QT interval No STEMI.  Confirmed by Nanda Quinton [phone]) on 07/26/2018 9:52:43 AM       ____________________________________________  RADIOLOGY  Dg Chest 2 View  Result Date: 07/26/2018 CLINICAL DATA:  Shortness of breath, hemoptysis. EXAM: CHEST - 2 VIEW COMPARISON:  None. FINDINGS: Mild cardiomegaly is noted with central pulmonary vascular congestion. Probable mild bilateral pulmonary edema is noted. No pneumothorax or pleural effusion is noted. Bony thorax is unremarkable. IMPRESSION: Mild cardiomegaly with central pulmonary vascular congestion and probable mild bilateral pulmonary edema. Electronically Signed   By: Marijo Conception, M.D.   On: 07/26/2018 10:11    ____________________________________________   PROCEDURES  Procedure(s) performed:   .Critical Care Performed by: Margette Fast, MD Authorized by: Margette Fast, MD   Critical care provider statement:    Critical care time (minutes):  35   Critical care time was exclusive of:   Teaching time and separately billable procedures and treating other patients   Critical care was necessary to treat or prevent imminent or life-threatening deterioration of the following conditions:  Circulatory failure, respiratory failure and cardiac failure   Critical care was time spent personally by me on the following activities:  Blood draw for specimens, development of treatment plan with patient or surrogate, discussions with consultants, evaluation of patient's response to treatment, examination of patient, obtaining history from patient or surrogate, ordering and performing treatments and interventions, ordering and review of laboratory studies, ordering and review of radiographic studies, pulse oximetry, re-evaluation of patient's condition and review of old charts   I assumed direction of critical care for this patient from another provider in my specialty: no       ____________________________________________   INITIAL IMPRESSION / [place] / ED COURSE  Pertinent labs & imaging results that were available during my care of the patient were reviewed by me and considered in my medical decision making (see chart for details).  She presents emergency department with intermittent chest pain and dyspnea.  He has extremely high blood pressure here  along with tachycardia and mild hypoxemia.  Lungs are clear with no wheezing.  He does smoke cigarettes.  Does not appear volume overloaded.  Considering multiple possible diagnoses including PE, hypertensive emergency, ACS. Plan for labetalol PRN for now and labs including CXR and troponin/d-dimer.   11:49 AM Patient with multiple lab abnormalities including renal failure, elevated troponin/d-dimer.  Doubt primary ACS or PE plan for enzyme trending and VQ scan.  Spoke with nephrology, Dr. Lowanda Foster.  She has blood pressure controlled in the emergency department with as needed doses of labetalol.  No infusion required at this time but  will continue to monitor.   Discussed patient's case with Hospitalist, Dr. Denton Brick to request admission. Patient and family (if present) updated with plan. Care transferred to Hospitalist service.  I reviewed all nursing notes, vitals, pertinent old records, EKGs, labs, imaging (as available).  ____________________________________________  FINAL CLINICAL IMPRESSION(S) / ED DIAGNOSES  Final diagnoses:  Hypertensive emergency  Renal failure, unspecified chronicity  Precordial chest pain  Dyspnea on exertion     MEDICATIONS GIVEN DURING THIS VISIT:  Medications  potassium chloride 10 mEq in 100 mL IVPB (10 mEq Intravenous New Bag/Given 07/26/18 1100)  sodium chloride flush (NS) 0.9 % injection 3 mL (has no administration in time range)  sodium chloride flush (NS) 0.9 % injection 3 mL (has no administration in time range)  0.9 %  sodium chloride infusion (has no administration in time range)  acetaminophen (TYLENOL) tablet 650 mg (has no administration in time range)    Or  acetaminophen (TYLENOL) suppository 650 mg (has no administration in time range)  traZODone (DESYREL) tablet 50 mg (has no administration in time range)  polyethylene glycol (MIRALAX / GLYCOLAX) packet 17 g (has no administration in time range)  ondansetron (ZOFRAN) tablet 4 mg (has no administration in time range)    Or  ondansetron (ZOFRAN) injection 4 mg (has no administration in time range)  albuterol (PROVENTIL) (2.5 MG/3ML) 0.083% nebulizer solution 2.5 mg (has no administration in time range)  heparin injection 5,000 Units (has no administration in time range)  furosemide (LASIX) injection 40 mg (has no administration in time range)  potassium chloride SA (K-DUR,KLOR-CON) CR tablet 40 mEq (has no administration in time range)  potassium chloride SA (K-DUR,KLOR-CON) CR tablet 40 mEq (has no administration in time range)  amLODipine (NORVASC) tablet 10 mg (has no administration in time range)  isosorbide  mononitrate (IMDUR) 24 hr tablet 30 mg (has no administration in time range)  hydrALAZINE (APRESOLINE) tablet 50 mg (has no administration in time range)  labetalol (NORMODYNE,TRANDATE) injection 10 mg (has no administration in time range)  labetalol (NORMODYNE,TRANDATE) injection 10 mg (10 mg Intravenous Given 07/26/18 0955)  aspirin chewable tablet 324 mg (324 mg Oral Given 07/26/18 1043)    Note:  This document was prepared using Dragon voice recognition software and may include unintentional dictation errors.  Nanda Quinton, MD Emergency Medicine    Long, [facility], MD 07/26/18 [phone]

## 2018-07-27 LAB — RENAL FUNCTION PANEL
ANION GAP: 13 (ref 5–15)
Albumin: 3.1 g/dL — ABNORMAL LOW (ref 3.5–5.0)
BUN: 71 mg/dL — ABNORMAL HIGH (ref 6–20)
CHLORIDE: 95 mmol/L — AB (ref 98–111)
CO2: 27 mmol/L (ref 22–32)
Calcium: 8.8 mg/dL — ABNORMAL LOW (ref 8.9–10.3)
Creatinine, Ser: 9.39 mg/dL — ABNORMAL HIGH (ref 0.61–1.24)
GFR calc non Af Amer: 7 mL/min — ABNORMAL LOW (ref >60)
GFR, EST AFRICAN AMERICAN: 8 mL/min — AB (ref >60)
Glucose, Bld: 103 mg/dL — ABNORMAL HIGH (ref 70–99)
POTASSIUM: 2.9 mmol/L — AB (ref 3.5–5.1)
Phosphorus: 5.1 mg/dL — ABNORMAL HIGH (ref 2.5–4.6)
Sodium: 135 mmol/L (ref 135–145)

## 2018-07-27 LAB — CBC
HEMATOCRIT: 28.1 % — AB (ref 39.0–52.0)
HEMOGLOBIN: 9.3 g/dL — AB (ref 13.0–17.0)
MCH: 28.8 pg (ref 26.0–34.0)
MCHC: 33.1 g/dL (ref 30.0–36.0)
MCV: 87 fL (ref 78.0–100.0)
Platelets: 130 10*3/uL — ABNORMAL LOW (ref 150–400)
RBC: 3.23 MIL/uL — AB (ref 4.22–5.81)
RDW: 16.7 % — ABNORMAL HIGH (ref 11.5–15.5)
WBC: 9.8 10*3/uL (ref 4.0–10.5)

## 2018-07-27 LAB — HIV ANTIBODY (ROUTINE TESTING W REFLEX): HIV Screen 4th Generation wRfx: NONREACTIVE

## 2018-07-27 LAB — IRON AND TIBC
IRON: 57 ug/dL (ref 45–182)
SATURATION RATIOS: 20 % (ref 17.9–39.5)
TIBC: 288 ug/dL (ref 250–450)
UIBC: 231 ug/dL

## 2018-07-27 LAB — HEPATITIS B SURFACE ANTIGEN: Hepatitis B Surface Ag: NEGATIVE

## 2018-07-27 LAB — FERRITIN: FERRITIN: 121 ng/mL (ref 24–336)

## 2018-07-27 MED ORDER — POTASSIUM CHLORIDE CRYS ER 20 MEQ PO TBCR
40.0000 meq | EXTENDED_RELEASE_TABLET | Freq: Once | ORAL | Status: AC
  Administered 2018-07-27: 40 meq via ORAL
  Filled 2018-07-27: qty 4

## 2018-07-27 MED ORDER — CARVEDILOL 3.125 MG PO TABS
3.1250 mg | ORAL_TABLET | Freq: Two times a day (BID) | ORAL | Status: DC
  Administered 2018-07-28: 3.125 mg via ORAL
  Filled 2018-07-27: qty 1

## 2018-07-27 MED ORDER — POTASSIUM CHLORIDE CRYS ER 20 MEQ PO TBCR: 40.0000 meq | EXTENDED_RELEASE_TABLET | Freq: Once | ORAL | Status: DC

## 2018-07-27 MED ORDER — POTASSIUM CHLORIDE CRYS ER 20 MEQ PO TBCR
40.0000 meq | EXTENDED_RELEASE_TABLET | Freq: Once | ORAL | Status: DC
  Filled 2018-07-27: qty 4

## 2018-07-27 MED ORDER — LABETALOL HCL 200 MG PO TABS
200.0000 mg | ORAL_TABLET | Freq: Two times a day (BID) | ORAL | Status: DC
  Filled 2018-07-27: qty 1

## 2018-07-27 MED ORDER — ISOSORBIDE MONONITRATE ER 60 MG PO TB24
60.0000 mg | ORAL_TABLET | Freq: Every day | ORAL | Status: DC
  Administered 2018-07-27 – 2018-08-06 (×10): 60 mg via ORAL
  Filled 2018-07-27 (×11): qty 1

## 2018-07-27 MED ORDER — HYDRALAZINE HCL 25 MG PO TABS
100.0000 mg | ORAL_TABLET | Freq: Three times a day (TID) | ORAL | Status: DC
  Administered 2018-07-27 – 2018-08-06 (×31): 100 mg via ORAL
  Filled 2018-07-27 (×31): qty 4

## 2018-07-27 MED ORDER — LABETALOL HCL 5 MG/ML IV SOLN: 10.0000 mg | INTRAVENOUS | Status: DC | PRN

## 2018-07-27 MED ORDER — POTASSIUM CHLORIDE CRYS ER 20 MEQ PO TBCR
40.0000 meq | EXTENDED_RELEASE_TABLET | Freq: Once | ORAL | Status: AC
  Administered 2018-07-27: 40 meq via ORAL
  Filled 2018-07-27: qty 2

## 2018-07-27 MED ORDER — LABETALOL HCL 5 MG/ML IV SOLN
20.0000 mg | Freq: Once | INTRAVENOUS | Status: DC
  Filled 2018-07-27 (×2): qty 4

## 2018-07-27 NOTE — Progress Notes (Signed)
Subjective: Interval History: has no complaint of chest pain or difficulty breathing.  Patient is feeling much better.  He denies also any nausea or vomiting..  Objective: Vital signs in last 24 hours: Temp:  [97.8 F (36.6 C)-98.2 F (36.8 C)] 98.2 F (36.8 C) (09/21 0701) Pulse Rate:  [95-120] 109 (09/21 0701) Resp:  [16-36] 16 (09/21 0701) BP: (173-251)/(113-169) 220/130 (09/21 0701) SpO2:  [86 %-98 %] 97 % (09/21 0701) Weight:  [79 kg] 79 kg (09/20 1900) Weight change:   Intake/Output from previous day: 09/20 0701 - 09/21 0700 In: 1331.8 [P.O.:240; I.V.:796.7; IV Piggyback:295.1] Out: 400 [Urine:400] Intake/Output this shift: No intake/output data recorded.  General appearance: alert, cooperative and no distress Resp: clear to auscultation bilaterally Cardio: regular rate and rhythm Extremities: No edema  Lab Results: Recent Labs    07/26/18 0958  WBC 12.8*  HGB 11.2*  HCT 33.9*  PLT 151   BMET:  Recent Labs    07/26/18 0958 07/27/18 0558  NA 136 135  K 2.6* 2.9*  CL 92* 95*  CO2 28 27  GLUCOSE 119* 103*  BUN 73* 71*  CREATININE 9.55* 9.39*  CALCIUM 9.2 8.8*   No results for input(s): PTH in the last 72 hours. Iron Studies: No results for input(s): IRON, TIBC, TRANSFERRIN, FERRITIN in the last 72 hours.  Studies/Results: Dg Chest 2 View  Result Date: 07/26/2018 CLINICAL DATA:  Shortness of breath, hemoptysis. EXAM: CHEST - 2 VIEW COMPARISON:  None. FINDINGS: Mild cardiomegaly is noted with central pulmonary vascular congestion. Probable mild bilateral pulmonary edema is noted. No pneumothorax or pleural effusion is noted. Bony thorax is unremarkable. IMPRESSION: Mild cardiomegaly with central pulmonary vascular congestion and probable mild bilateral pulmonary edema. Electronically Signed   By: Marijo Conception, M.D.   On: 07/26/2018 10:11   US Renal  Result Date: 07/26/2018 CLINICAL DATA:  Renal failure. EXAM: RENAL / URINARY TRACT ULTRASOUND COMPLETE  COMPARISON:  07/26/2018. FINDINGS: Right Kidney: Length: 10.9 cm. Increased echogenicity. 1.7 cm hyperechoic shadowing density noted the midportion of the right kidney. This could be a calcified renal mass. Large renal stone could also present this fashion. No hydronephrosis visualized. Left Kidney: Length: 9.4 cm. Increased echogenicity. 1.9 cm complex cystic versus solid mass noted the midportion of the left kidney. No hydronephrosis visualized. Bladder: Appears normal for degree of bladder distention. IMPRESSION: 1. Increased echogenicity both kidneys consistent with chronic medical renal disease. 2. 1.7 cm hyperechoic shadowing density in the midportion of the right kidney. This could represent a calcified renal mass. Large renal stone could present in this fashion. 1.9 cm complex cystic versus solid mass in the midportion of the left kidney. A renal tumor in either kidney cannot be excluded. For further evaluation MRI of the kidneys suggested. Electronically Signed   By: Marcello Moores  Register   On: 07/26/2018 13:57   Nm Pulmonary Vent And Perf (v/q Scan)  Result Date: 07/26/2018 CLINICAL DATA:  Shortness of Breath EXAM: NUCLEAR MEDICINE VENTILATION - PERFUSION LUNG SCAN VIEWS: Anterior, posterior, left lateral, right lateral, RPO, LPO, RAO, LAO-ventilation and perfusion RADIOPHARMACEUTICALS:  33.0 mCi of Tc-3m DTPA aerosol inhalation and 4.4 mCi Tc49m-MAA IV COMPARISON:  Chest radiograph July 26, 2018 FINDINGS: Ventilation: Radiotracer uptake is homogeneous and symmetric bilaterally. No ventilation defects are evident. Perfusion: Radiotracer uptake is homogeneous and symmetric bilaterally. No perfusion defects are evident. IMPRESSION: No ventilation or perfusion defects. Very low probability of pulmonary embolus. Electronically Signed   By: Lowella Grip III M.D.  On: 07/26/2018 13:33    I have reviewed the patient's current medications.  Assessment/Plan: 1] malignant hypertension: Presently  patient is on amlodipine and also hydralazine and also labetalol IV.  His blood pressure still high but improving. 2] renal failure: Possibly chronic.  Ultrasound of the kidney showed right kidney to be 10.9 and left kidney was 9.4.  There is increased echogenicity bilaterally.  Patient presently nonoliguric.  Work-up for secondary course of renal failure is pending. 3] hypokalemia: Potassium is low but improving.  Presently he is on potassium supplement. 4] anemia: Etiology at this moment not clear. 5] bone mineral disorder: His calcium and phosphorus is 6 range 6] left complex renal cyst which is incidentalfinding Plan: We will increase IV fluid to 135 cc/h 2] we will do iron studies in the morning 3] we will start patient on labetalol 200 mg p.o. twice daily 4] we will check his  renal panel in the morning 5] patient may need an MRI of his kidneys.  .(!) 220/130      LOS: 1 day   Marie Borowski S 07/27/2018,8:06 AM

## 2018-07-27 NOTE — Progress Notes (Addendum)
Patient Demographics:    Ronte Parker, is a 27 y.o. male, DOB - Sep 24, 1991, MBT:[id number]  Admit date - 07/26/2018   Admitting Physician Verleen Stuckey Denton Brick, MD  Outpatient Primary MD for the patient is Patient, No Pcp Per  LOS - 1   Chief Complaint  Patient presents with  . Shortness of Breath        Subjective:    Akeen Ledyard today has no fevers, no emesis,  No chest pain, voiding okay, mother at bedside, questions answered, shortness of breath actually resolved, no orthopnea dyspnea on exertion is improved  Assessment  & Plan :    Principal Problem:   Hypertensive urgency Active Problems:   Dyspnea   Acute CHF (congestive heart failure) (HCC)   Acute kidney injury superimposed on CKD [facility])  Brief Summary:- 27 year old male with long-standing history of untreated hypertension admitted on 07/26/2018 with severely elevated blood pressures 251/154 mmhg and findings consistent with acute heart failure as well as kidney failure, on admission BNP was over 2200, clinical exam and chest x-ray was suggestive of pulmonary edema  Plan:-  1)Hypertensive Emergency--- blood pressure remains elevated, nephrology input appreciated,, on presentation systolic blood pressure was over 536 and diastolic blood pressure was over 150 for now the goal will be to keep systolic blood pressure in the 160-180 range, c/n amlodipine 10 mg daily, hydralazine increased to 100 mg 3 times daily, isosorbide increased to 60 mg daily,,   may use IV labetalol when necessary  Every 4 hours for systolic blood pressure over 200 mmhg, add Coreg 3.125 mg bid  2)HFrEF----EF is 20%, patient appears to have acute systolic dysfunction CHF, add Coreg 3.125 mg twice daily on 07/28/2018 once acute CHF symptoms improve further, use afterload reduction agents including isosorbide/hydralazine as noted above, unable to use ACE/ARB or Aldactone  due  to kidney concerns, nephrologist advised discontinuation of IV Lasix, monitor closely and consider restarting diuretics if patient becomes symptomatic, take daily weights and fluid input and output, discussed with cardiologist on-call Dr. Dorris Carnes, official cardiology consult pending ???  Need to rule out ischemia, hypoxia is resolving.     3)AKI----acute kidney injury on CKD stage - ??? Baseline CKD stage , suspect disease AKI on CKD, renal ultrasound suggest medical renal kidney disease without obstructive uropathy in the setting of uncontrolled hypertension,,  hypokalemia noted,    creatinine on admission= 9.55 with a BUN of 73,   ,   baseline creatinine = ??? Baseline    , creatinine down to 9.3,  renally adjust medications, avoid nephrotoxic agents/dehydration/hypotension, prior renal parameters not available,  nephrology consult appreciated, further work-up including hepatitis profile, UA, complement series, aldosterone and renin, uric acid, ANA, ANCA and antistreptolysin titers are pending, nephrologist advised discontinuation of IV Lasix, nephrologist has increased patient's IV fluids  4)Tobacco Abuse--- smoking cessation strongly advised  5)???  Acute on chronic normocytic and hypochromic Anemia--- suspect at baseline patient has some degree of chronic anemia due to CKD, hemoglobin is down from 11.2 on admission to 9.3 most likely due to hemodilution with IV fluids, check stool occult blood, iron studies and ferritin pending  Disposition/Need for in-Hospital Stay- patient unable to be discharged at this time due to severe renal dysfunction with creatinine above  9, severely high blood pressures with symptoms of acute heart failure requiring close monitoring (EF is only 20%-high risk of sudden death),   Code Status : Full   Disposition Plan  : home   Consults  :  Nephrology/Cardiology   DVT Prophylaxis  :    - Heparin   Lab Results  Component Value Date   PLT 130 (L) 07/27/2018     Inpatient Medications  Scheduled Meds: . amLODipine  10 mg Oral Daily  . heparin  5,000 Units Subcutaneous Q8H  . hydrALAZINE  100 mg Oral TID  . Influenza vac split quadrivalent PF  0.5 mL Intramuscular Tomorrow-1000  . isosorbide mononitrate  60 mg Oral Daily  . labetalol  200 mg Oral BID  . labetalol  20 mg Intravenous Once  . potassium chloride  40 mEq Oral Once  . potassium chloride  40 mEq Oral Once  . potassium chloride  40 mEq Oral Once  . sodium chloride flush  3 mL Intravenous Q12H   Continuous Infusions: . 0.45 % NaCl with KCl 20 mEq / L 135 mL/hr at 07/27/18 0831  . sodium chloride     PRN Meds:.sodium chloride, acetaminophen **OR** acetaminophen, albuterol, labetalol, ondansetron **OR** ondansetron (ZOFRAN) IV, polyethylene glycol, sodium chloride flush, traZODone    Anti-infectives (From admission, onward)   None        Objective:   Vitals:   07/26/18 2238 07/27/18 0701 07/27/18 0845 07/27/18 0957  BP: (!) 176/125 (!) 220/130 (!) 171/118 (!) 157/109  Pulse: (!) 105 (!) 109 94 98  Resp: 18 16    Temp: 97.8 F (36.6 C) 98.2 F (36.8 C)    TempSrc: Oral Oral    SpO2: 94% 97%    Weight:        Wt Readings from Last 3 Encounters:  07/26/18 79 kg     Intake/Output Summary (Last 24 hours) at 07/27/2018 1228 Last data filed at 07/27/2018 6808 Gross per 24 hour  Intake 1231.84 ml  Output 400 ml  Net 831.84 ml     Physical Exam Patient is examined daily including today on 07/27/18, exams remain the same as of yesterday except that has changed   Gen:- Awake Alert, in no acute distress, speaking in sentences HEENT:- [place].AT, No sclera icterus Neck-Supple Neck,No JVD,.  Lungs-diminished in bases, very faint bibasilar rales,  CV- S1, S2 normal, regular Abd-  +ve B.Sounds, Abd Soft, No tenderness,    Extremity/Skin:- Trace edema,   good pulses Psych-affect is appropriate, oriented x3 Neuro-no new focal deficits, no tremors   Data Review:   Micro  Results No results found for this or any previous visit (from the past 240 hour(s)).  Radiology Reports Dg Chest 2 View  Result Date: 07/26/2018 CLINICAL DATA:  Shortness of breath, hemoptysis. EXAM: CHEST - 2 VIEW COMPARISON:  None. FINDINGS: Mild cardiomegaly is noted with central pulmonary vascular congestion. Probable mild bilateral pulmonary edema is noted. No pneumothorax or pleural effusion is noted. Bony thorax is unremarkable. IMPRESSION: Mild cardiomegaly with central pulmonary vascular congestion and probable mild bilateral pulmonary edema. Electronically Signed   By: Marijo Conception, M.D.   On: 07/26/2018 10:11   US Renal  Result Date: 07/26/2018 CLINICAL DATA:  Renal failure. EXAM: RENAL / URINARY TRACT ULTRASOUND COMPLETE COMPARISON:  07/26/2018. FINDINGS: Right Kidney: Length: 10.9 cm. Increased echogenicity. 1.7 cm hyperechoic shadowing density noted the midportion of the right kidney. This could be a calcified renal mass. Large renal stone could also  present this fashion. No hydronephrosis visualized. Left Kidney: Length: 9.4 cm. Increased echogenicity. 1.9 cm complex cystic versus solid mass noted the midportion of the left kidney. No hydronephrosis visualized. Bladder: Appears normal for degree of bladder distention. IMPRESSION: 1. Increased echogenicity both kidneys consistent with chronic medical renal disease. 2. 1.7 cm hyperechoic shadowing density in the midportion of the right kidney. This could represent a calcified renal mass. Large renal stone could present in this fashion. 1.9 cm complex cystic versus solid mass in the midportion of the left kidney. A renal tumor in either kidney cannot be excluded. For further evaluation MRI of the kidneys suggested. Electronically Signed   By: Marcello Moores  Register   On: 07/26/2018 13:57   Nm Pulmonary Vent And Perf (v/q Scan)  Result Date: 07/26/2018 CLINICAL DATA:  Shortness of Breath EXAM: NUCLEAR MEDICINE VENTILATION - PERFUSION LUNG SCAN  VIEWS: Anterior, posterior, left lateral, right lateral, RPO, LPO, RAO, LAO-ventilation and perfusion RADIOPHARMACEUTICALS:  33.0 mCi of Tc-12m DTPA aerosol inhalation and 4.4 mCi Tc32m-MAA IV COMPARISON:  Chest radiograph July 26, 2018 FINDINGS: Ventilation: Radiotracer uptake is homogeneous and symmetric bilaterally. No ventilation defects are evident. Perfusion: Radiotracer uptake is homogeneous and symmetric bilaterally. No perfusion defects are evident. IMPRESSION: No ventilation or perfusion defects. Very low probability of pulmonary embolus. Electronically Signed   By: Lowella Grip III M.D.   On: 07/26/2018 13:33     CBC Recent Labs  Lab 07/26/18 0958 07/27/18 0558  WBC 12.8* 9.8  HGB 11.2* 9.3*  HCT 33.9* 28.1*  PLT 151 130*  MCV 86.7 87.0  MCH 28.6 28.8  MCHC 33.0 33.1  RDW 16.7* 16.7*  LYMPHSABS 1.4  --   MONOABS 0.7  --   EOSABS 0.1  --   BASOSABS 0.1  --     Chemistries  Recent Labs  Lab 07/26/18 0958 07/27/18 0558  NA 136 135  K 2.6* 2.9*  CL 92* 95*  CO2 28 27  GLUCOSE 119* 103*  BUN 73* 71*  CREATININE 9.55* 9.39*  CALCIUM 9.2 8.8*  AST 25  --   ALT 30  --   ALKPHOS 79  --   BILITOT 1.7*  --    ------------------------------------------------------------------------------------------------------------------ No results for input(s): CHOL, HDL, LDLCALC, TRIG, CHOLHDL, LDLDIRECT in the last 72 hours.  No results found for: HGBA1C ------------------------------------------------------------------------------------------------------------------ No results for input(s): TSH, T4TOTAL, T3FREE, THYROIDAB in the last 72 hours.  Invalid input(s): FREET3 ------------------------------------------------------------------------------------------------------------------ No results for input(s): VITAMINB12, FOLATE, FERRITIN, TIBC, IRON, RETICCTPCT in the last 72 hours.  Coagulation profile No results for input(s): INR, PROTIME in the last 168  hours.  Recent Labs    07/26/18 0958  DDIMER 1.41*    Cardiac Enzymes Recent Labs  Lab 07/26/18 2134  TROPONINI 0.25*   ------------------------------------------------------------------------------------------------------------------    Component Value Date/Time   BNP 2,260.0 (H) 07/26/2018 1700     Roxan Hockey M.D on 07/27/2018 at 12:28 PM  Pager---[phone] Go to [email or web address] - password [email or web address] for contact info  [facility] - Office  [phone]

## 2018-07-28 LAB — RENAL FUNCTION PANEL
ALBUMIN: 3.1 g/dL — AB (ref 3.5–5.0)
ANION GAP: 14 (ref 5–15)
BUN: 67 mg/dL — ABNORMAL HIGH (ref 6–20)
CALCIUM: 8.9 mg/dL (ref 8.9–10.3)
CO2: 23 mmol/L (ref 22–32)
Chloride: 96 mmol/L — ABNORMAL LOW (ref 98–111)
Creatinine, Ser: 9.07 mg/dL — ABNORMAL HIGH (ref 0.61–1.24)
GFR calc Af Amer: 8 mL/min — ABNORMAL LOW (ref >60)
GFR, EST NON AFRICAN AMERICAN: 7 mL/min — AB (ref >60)
GLUCOSE: 97 mg/dL (ref 70–99)
PHOSPHORUS: 3.9 mg/dL (ref 2.5–4.6)
POTASSIUM: 4.2 mmol/L (ref 3.5–5.1)
SODIUM: 133 mmol/L — AB (ref 135–145)

## 2018-07-28 LAB — CBC
HCT: 28.1 % — ABNORMAL LOW (ref 39.0–52.0)
Hemoglobin: 9.3 g/dL — ABNORMAL LOW (ref 13.0–17.0)
MCH: 29.2 pg (ref 26.0–34.0)
MCHC: 33.1 g/dL (ref 30.0–36.0)
MCV: 88.1 fL (ref 78.0–100.0)
PLATELETS: 155 10*3/uL (ref 150–400)
RBC: 3.19 MIL/uL — ABNORMAL LOW (ref 4.22–5.81)
RDW: 17.2 % — AB (ref 11.5–15.5)
WBC: 10.3 10*3/uL (ref 4.0–10.5)

## 2018-07-28 LAB — HEPATITIS C ANTIBODY: HCV Ab: <0.1 s/co ratio (ref 0.0–0.9)

## 2018-07-28 LAB — URIC ACID: Uric Acid, Serum: 11.8 mg/dL — ABNORMAL HIGH (ref 3.7–8.6)

## 2018-07-28 MED ORDER — FEBUXOSTAT 40 MG PO TABS
40.0000 mg | ORAL_TABLET | Freq: Every day | ORAL | Status: DC
  Administered 2018-07-28 – 2018-08-04 (×8): 40 mg via ORAL
  Filled 2018-07-28 (×8): qty 1

## 2018-07-28 MED ORDER — FUROSEMIDE 10 MG/ML IJ SOLN: 100.0000 mg | Freq: Two times a day (BID) | INTRAVENOUS | Status: DC

## 2018-07-28 MED ORDER — ALLOPURINOL 100 MG PO TABS
200.0000 mg | ORAL_TABLET | Freq: Every day | ORAL | Status: DC
  Administered 2018-07-28 – 2018-08-06 (×10): 200 mg via ORAL
  Filled 2018-07-28 (×10): qty 2

## 2018-07-28 MED ORDER — FUROSEMIDE 10 MG/ML IJ SOLN
100.0000 mg | Freq: Two times a day (BID) | INTRAVENOUS | Status: DC
  Administered 2018-07-28 – 2018-08-01 (×8): 100 mg via INTRAVENOUS
  Filled 2018-07-28 (×12): qty 10

## 2018-07-28 MED ORDER — ALLOPURINOL 20 MG/ML ORAL SUSPENSION: 200.0000 mg | Freq: Every day | ORAL | Status: DC

## 2018-07-28 MED ORDER — ACETAMINOPHEN 325 MG PO TABS: 650.0000 mg | ORAL_TABLET | Freq: Four times a day (QID) | ORAL | Status: DC | PRN

## 2018-07-28 MED ORDER — CARVEDILOL 3.125 MG PO TABS
6.2500 mg | ORAL_TABLET | Freq: Two times a day (BID) | ORAL | Status: DC
  Administered 2018-07-28 – 2018-07-29 (×2): 6.25 mg via ORAL
  Filled 2018-07-28 (×3): qty 2

## 2018-07-28 MED ORDER — SODIUM CHLORIDE 0.9 % IV SOLN
INTRAVENOUS | Status: DC
  Administered 2018-07-28 – 2018-07-29 (×2): via INTRAVENOUS

## 2018-07-28 NOTE — Progress Notes (Signed)
Patient Demographics:    Edward Thomas, is a 27 y.o. male, DOB - 11-25-1990, OAC:[id number]  Admit date - 07/26/2018   Admitting Physician Cheron Pasquarelli Denton Brick, MD  Outpatient Primary MD for the patient is Patient, No Pcp Per  LOS - 2   Chief Complaint  Patient presents with  . Shortness of Breath        Subjective:    Edward Thomas today has no fevers, had emesis x2 after meals on 07/27/2018,  no chest pain, voiding okay, mother at bedside, questions answered, shortness of breath actually resolved, no orthopnea, dyspnea on exertion is improving, Dr Hinda Lenis is at bedside  Assessment  & Plan :    Principal Problem:   Hypertensive urgency Active Problems:   Dyspnea   Acute CHF (congestive heart failure) (HCC)   Acute kidney injury superimposed on CKD [facility])  Brief Summary:- 27 year old male with long-standing history of untreated hypertension admitted on 07/26/2018 with severely elevated blood pressures 251/154 mmhg and findings consistent with acute heart failure as well as kidney failure (Creatinine > 9), on admission BNP was over 2200, clinical exam and chest x-ray was suggestive of pulmonary edema  Plan:-  1)Hypertensive Emergency--- blood pressure remains elevated, nephrology input appreciated,, on presentation systolic blood pressure was over 010 and diastolic blood pressure was over 150 for now the goal will be to keep systolic blood pressure in the 160-180 range, c/n amlodipine 10 mg daily, hydralazine  100 mg 3 times daily, isosorbide increased to 60 mg daily,,   may use IV labetalol when necessary  Every 4 hours for systolic blood pressure over 200 mmhg, add Coreg 3.125 mg bid  2)HFrEF----EF is 20%, patient appears to have acute systolic dysfunction CHF, increase Coreg to 6.25 mg twice daily , use afterload reduction agents including isosorbide/hydralazine as noted above, unable to use ACE/ARB  or Aldactone  due to kidney concerns,  daily weights and fluid input and output, discussed with cardiologist on-call Dr. Dorris Carnes, official cardiology consult pending ???  Need to rule out ischemia, however renal function precludes left heart cath , hypoxia is resolving.     3)AKI----acute kidney injury on CKD stage - ??? Baseline CKD stage , suspect disease AKI on CKD, renal ultrasound suggest medical renal kidney disease without obstructive uropathy in the setting of uncontrolled hypertension,,  hypokalemia noted,    creatinine on admission= 9.55 with a BUN of 73,   ,   baseline creatinine = ??? Baseline    , creatinine down to 9.0,  renally adjust medications, avoid nephrotoxic agents/dehydration/hypotension, prior renal parameters not available,  nephrology consult appreciated, further work-up including hepatitis profile, UA, complement series, aldosterone and renin, uric acid, ANA, ANCA and antistreptolysin titers are pending, iv Lasix and IVF per Nephrologist, be judicious with IV fluids given EF of 20% to avoid volume overload. Pt with Nausea, fatigue , episodes of emesis,??? Uremia, ?? If needs HD  4)Tobacco Abuse--- smoking cessation strongly advised  5)???  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--- suspect at baseline patient has some degree of chronic anemia due to CKD, hemoglobin is down from 11.2 on admission to 9.3 most likely due to hemodilution with IV fluids, check stool occult blood, iron studies within normal limits, with  serum iron of 57, TIBC of 288 and saturation of 20,  ferritin wnl (121)  6)Hyperuricemia- Uric acid is 11.8, start allopurinol, no history of kidney stones no history of gout  Disposition/Need for in-Hospital Stay- patient unable to be discharged at this time due to severe renal dysfunction with creatinine above 9, severely high blood pressures with symptoms of acute heart failure requiring close monitoring (EF is only 20%-high risk of sudden  death), requiring IV diuretics, may need hemodialysis  Code Status : Full  Disposition Plan  : home   Consults  :  Nephrology/Cardiology  DVT Prophylaxis  :    - Heparin   Lab Results  Component Value Date   PLT 155 07/28/2018    Inpatient Medications  Scheduled Meds: . amLODipine  10 mg Oral Daily  . carvedilol  3.125 mg Oral BID WC  . febuxostat  40 mg Oral Daily  . heparin  5,000 Units Subcutaneous Q8H  . hydrALAZINE  100 mg Oral TID  . Influenza vac split quadrivalent PF  0.5 mL Intramuscular Tomorrow-1000  . isosorbide mononitrate  60 mg Oral Daily  . labetalol  20 mg Intravenous Once  . sodium chloride flush  3 mL Intravenous Q12H   Continuous Infusions: . sodium chloride    . sodium chloride     PRN Meds:.sodium chloride, acetaminophen **OR** acetaminophen, albuterol, labetalol, ondansetron **OR** ondansetron (ZOFRAN) IV, polyethylene glycol, sodium chloride flush, traZODone   Anti-infectives (From admission, onward)   None        Objective:   Vitals:   07/27/18 1627 07/27/18 1757 07/27/18 2207 07/28/18 0617  BP: (!) 183/138 (!) 175/120 (!) 176/128 (!) 197/120  Pulse: (!) 105 (!) 103 (!) 112 (!) 108  Resp:   20 16  Temp: 97.8 F (36.6 C)  98.2 F (36.8 C) 98.1 F (36.7 C)  TempSrc: Oral  Oral Oral  SpO2: 94% 100% 96% 91%  Weight:        Wt Readings from Last 3 Encounters:  07/26/18 79 kg     Intake/Output Summary (Last 24 hours) at 07/28/2018 8182 Last data filed at 07/28/2018 9937 Gross per 24 hour  Intake 3687.01 ml  Output -  Net 3687.01 ml     Physical Exam Patient is examined daily including today on 07/28/18, exams remain the same as of yesterday except that has changed   Gen:- Awake Alert, in no acute distress, speaking in sentences HEENT:- [place].AT, No sclera icterus Neck-Supple Neck,No JVD,.  Lungs-improving air movement, faint bibasilar rales CV- S1, S2 normal, regular Abd-  +ve B.Sounds, Abd Soft, No tenderness,      Extremity/Skin:- Trace edema,   good pulses Psych-affect is appropriate, oriented x3 Neuro-no new focal deficits, no tremors   Data Review:   Micro Results No results found for this or any previous visit (from the past 240 hour(s)).  Radiology Reports Dg Chest 2 View  Result Date: 07/26/2018 CLINICAL DATA:  Shortness of breath, hemoptysis. EXAM: CHEST - 2 VIEW COMPARISON:  None. FINDINGS: Mild cardiomegaly is noted with central pulmonary vascular congestion. Probable mild bilateral pulmonary edema is noted. No pneumothorax or pleural effusion is noted. Bony thorax is unremarkable. IMPRESSION: Mild cardiomegaly with central pulmonary vascular congestion and probable mild bilateral pulmonary edema. Electronically Signed   By: Marijo Conception, M.D.   On: 07/26/2018 10:11   US Renal  Result Date: 07/26/2018 CLINICAL DATA:  Renal failure. EXAM: RENAL / URINARY TRACT ULTRASOUND COMPLETE COMPARISON:  07/26/2018. FINDINGS: Right Kidney:  Length: 10.9 cm. Increased echogenicity. 1.7 cm hyperechoic shadowing density noted the midportion of the right kidney. This could be a calcified renal mass. Large renal stone could also present this fashion. No hydronephrosis visualized. Left Kidney: Length: 9.4 cm. Increased echogenicity. 1.9 cm complex cystic versus solid mass noted the midportion of the left kidney. No hydronephrosis visualized. Bladder: Appears normal for degree of bladder distention. IMPRESSION: 1. Increased echogenicity both kidneys consistent with chronic medical renal disease. 2. 1.7 cm hyperechoic shadowing density in the midportion of the right kidney. This could represent a calcified renal mass. Large renal stone could present in this fashion. 1.9 cm complex cystic versus solid mass in the midportion of the left kidney. A renal tumor in either kidney cannot be excluded. For further evaluation MRI of the kidneys suggested. Electronically Signed   By: Marcello Moores  Register   On: 07/26/2018 13:57   Nm  Pulmonary Vent And Perf (v/q Scan)  Result Date: 07/26/2018 CLINICAL DATA:  Shortness of Breath EXAM: NUCLEAR MEDICINE VENTILATION - PERFUSION LUNG SCAN VIEWS: Anterior, posterior, left lateral, right lateral, RPO, LPO, RAO, LAO-ventilation and perfusion RADIOPHARMACEUTICALS:  33.0 mCi of Tc-30m DTPA aerosol inhalation and 4.4 mCi Tc60m-MAA IV COMPARISON:  Chest radiograph July 26, 2018 FINDINGS: Ventilation: Radiotracer uptake is homogeneous and symmetric bilaterally. No ventilation defects are evident. Perfusion: Radiotracer uptake is homogeneous and symmetric bilaterally. No perfusion defects are evident. IMPRESSION: No ventilation or perfusion defects. Very low probability of pulmonary embolus. Electronically Signed   By: Lowella Grip III M.D.   On: 07/26/2018 13:33     CBC Recent Labs  Lab 07/26/18 0958 07/27/18 0558 07/28/18 0624  WBC 12.8* 9.8 10.3  HGB 11.2* 9.3* 9.3*  HCT 33.9* 28.1* 28.1*  PLT 151 130* 155  MCV 86.7 87.0 88.1  MCH 28.6 28.8 29.2  MCHC 33.0 33.1 33.1  RDW 16.7* 16.7* 17.2*  LYMPHSABS 1.4  --   --   MONOABS 0.7  --   --   EOSABS 0.1  --   --   BASOSABS 0.1  --   --     Chemistries  Recent Labs  Lab 07/26/18 0958 07/27/18 0558 07/28/18 0624  NA 136 135 133*  K 2.6* 2.9* 4.2  CL 92* 95* 96*  CO2 28 27 23   GLUCOSE 119* 103* 97  BUN 73* 71* 67*  CREATININE 9.55* 9.39* 9.07*  CALCIUM 9.2 8.8* 8.9  AST 25  --   --   ALT 30  --   --   ALKPHOS 79  --   --   BILITOT 1.7*  --   --    ------------------------------------------------------------------------------------------------------------------ No results for input(s): CHOL, HDL, LDLCALC, TRIG, CHOLHDL, LDLDIRECT in the last 72 hours.  No results found for: HGBA1C ------------------------------------------------------------------------------------------------------------------ No results for input(s): TSH, T4TOTAL, T3FREE, THYROIDAB in the last 72 hours.  Invalid input(s):  FREET3 ------------------------------------------------------------------------------------------------------------------ Recent Labs    07/27/18 0558  FERRITIN 121  TIBC 288  IRON 57    Coagulation profile No results for input(s): INR, PROTIME in the last 168 hours.  Recent Labs    07/26/18 0958  DDIMER 1.41*    Cardiac Enzymes Recent Labs  Lab 07/26/18 2134  TROPONINI 0.25*   ------------------------------------------------------------------------------------------------------------------    Component Value Date/Time   BNP 2,260.0 (H) 07/26/2018 9381     Roxan Hockey M.D on 07/28/2018 at 9:18 AM  Pager---[phone] Go to [email or web address] - password [email or web address] for contact info  [facility] - Office  [phone]

## 2018-07-28 NOTE — Progress Notes (Addendum)
Subjective: Interval History: Patient has episode of vomiting last night but presently feeling better.  He said his appetite is good.  Patient denies any difficulty breathing.  Objective: Vital signs in last 24 hours: Temp:  [97.8 F (36.6 C)-98.2 F (36.8 C)] 98.1 F (36.7 C) (09/22 0617) Pulse Rate:  [98-112] 108 (09/22 0617) Resp:  [16-20] 16 (09/22 0617) BP: (157-197)/(109-138) 197/120 (09/22 0617) SpO2:  [91 %-100 %] 91 % (09/22 0617) Weight change:   Intake/Output from previous day: 09/21 0701 - 09/22 0700 In: 4047 [P.O.:840; I.V.:3207] Out: -  Intake/Output this shift: No intake/output data recorded.  Generally patient is alert and in no apparent distress Chest: Has bibasilar crackles, no wheezing Heart exam revealed regular rate and rhythm no murmur Extremities no edema  Lab Results: Recent Labs    07/27/18 0558 07/28/18 0624  WBC 9.8 10.3  HGB 9.3* 9.3*  HCT 28.1* 28.1*  PLT 130* 155   BMET:  Recent Labs    07/27/18 0558 07/28/18 0624  NA 135 133*  K 2.9* 4.2  CL 95* 96*  CO2 27 23  GLUCOSE 103* 97  BUN 71* 67*  CREATININE 9.39* 9.07*  CALCIUM 8.8* 8.9   No results for input(s): PTH in the last 72 hours. Iron Studies:  Recent Labs    07/27/18 0558  IRON 57  TIBC 288  FERRITIN 121    Studies/Results: Dg Chest 2 View  Result Date: 07/26/2018 CLINICAL DATA:  Shortness of breath, hemoptysis. EXAM: CHEST - 2 VIEW COMPARISON:  None. FINDINGS: Mild cardiomegaly is noted with central pulmonary vascular congestion. Probable mild bilateral pulmonary edema is noted. No pneumothorax or pleural effusion is noted. Bony thorax is unremarkable. IMPRESSION: Mild cardiomegaly with central pulmonary vascular congestion and probable mild bilateral pulmonary edema. Electronically Signed   By: Marijo Conception, M.D.   On: 07/26/2018 10:11   US Renal  Result Date: 07/26/2018 CLINICAL DATA:  Renal failure. EXAM: RENAL / URINARY TRACT ULTRASOUND COMPLETE COMPARISON:   07/26/2018. FINDINGS: Right Kidney: Length: 10.9 cm. Increased echogenicity. 1.7 cm hyperechoic shadowing density noted the midportion of the right kidney. This could be a calcified renal mass. Large renal stone could also present this fashion. No hydronephrosis visualized. Left Kidney: Length: 9.4 cm. Increased echogenicity. 1.9 cm complex cystic versus solid mass noted the midportion of the left kidney. No hydronephrosis visualized. Bladder: Appears normal for degree of bladder distention. IMPRESSION: 1. Increased echogenicity both kidneys consistent with chronic medical renal disease. 2. 1.7 cm hyperechoic shadowing density in the midportion of the right kidney. This could represent a calcified renal mass. Large renal stone could present in this fashion. 1.9 cm complex cystic versus solid mass in the midportion of the left kidney. A renal tumor in either kidney cannot be excluded. For further evaluation MRI of the kidneys suggested. Electronically Signed   By: Marcello Moores  Register   On: 07/26/2018 13:57   Nm Pulmonary Vent And Perf (v/q Scan)  Result Date: 07/26/2018 CLINICAL DATA:  Shortness of Breath EXAM: NUCLEAR MEDICINE VENTILATION - PERFUSION LUNG SCAN VIEWS: Anterior, posterior, left lateral, right lateral, RPO, LPO, RAO, LAO-ventilation and perfusion RADIOPHARMACEUTICALS:  33.0 mCi of Tc-46m DTPA aerosol inhalation and 4.4 mCi Tc6m-MAA IV COMPARISON:  Chest radiograph July 26, 2018 FINDINGS: Ventilation: Radiotracer uptake is homogeneous and symmetric bilaterally. No ventilation defects are evident. Perfusion: Radiotracer uptake is homogeneous and symmetric bilaterally. No perfusion defects are evident. IMPRESSION: No ventilation or perfusion defects. Very low probability of pulmonary embolus. Electronically Signed  By: Lowella Grip III M.D.   On: 07/26/2018 13:33    I have reviewed the patient's current medications.  Assessment/Plan: 1] malignant hypertension: Presently patient is on  amlodipine and also hydralazine and also labetalol 200 mg p.o. twice daily.  His blood pressure seems to be somewhat better. 2] renal failure: Possibly chronic.  His creatinine is 9 slightly improving.  Presently urine output is not documented.  Patient however states that he is making more urine now than before.  Hepatitis B surface antigen and hepatitis C antibodies nonreactive. 3] hypokalemia: Potassium is normal. 4] anemia: Etiology at this moment not clear.  Iron studies are pending. 5] bone mineral disorder: His calcium and phosphorus is in range 6] left complex renal cyst which is incidentalfinding 9] severe LVH possibly hypertensive.  His ejection fraction is 20 very low. Plan: We will change IV fluid to normal saline at 75 cc/h 2] we will start patient on Lasix 100 mg IV twice daily 3] if his renal function does not improve probably will consider dialysis.  I have discussed with the patient and seems agreeable. 4] we will check his  renal panel in the morning 5] Allopurinol 200 mg po once a day   .(!) 197/120      LOS: 2 days   Edward Thomas S 07/28/2018,9:01 AM

## 2018-07-29 DIAGNOSIS — R0600 Dyspnea, unspecified: Secondary | ICD-10-CM

## 2018-07-29 DIAGNOSIS — I519 Heart disease, unspecified: Secondary | ICD-10-CM

## 2018-07-29 DIAGNOSIS — I161 Hypertensive emergency: Principal | ICD-10-CM

## 2018-07-29 DIAGNOSIS — I5021 Acute systolic (congestive) heart failure: Secondary | ICD-10-CM

## 2018-07-29 DIAGNOSIS — N186 End stage renal disease: Secondary | ICD-10-CM

## 2018-07-29 DIAGNOSIS — N185 Chronic kidney disease, stage 5: Secondary | ICD-10-CM

## 2018-07-29 DIAGNOSIS — I517 Cardiomegaly: Secondary | ICD-10-CM

## 2018-07-29 DIAGNOSIS — N179 Acute kidney failure, unspecified: Secondary | ICD-10-CM

## 2018-07-29 DIAGNOSIS — N189 Chronic kidney disease, unspecified: Secondary | ICD-10-CM

## 2018-07-29 LAB — RENAL FUNCTION PANEL
Albumin: 3.1 g/dL — ABNORMAL LOW (ref 3.5–5.0)
Anion gap: 13 (ref 5–15)
BUN: 66 mg/dL — ABNORMAL HIGH (ref 6–20)
CALCIUM: 9 mg/dL (ref 8.9–10.3)
CO2: 24 mmol/L (ref 22–32)
CREATININE: 9.18 mg/dL — AB (ref 0.61–1.24)
Chloride: 98 mmol/L (ref 98–111)
GFR calc non Af Amer: 7 mL/min — ABNORMAL LOW (ref >60)
GFR, EST AFRICAN AMERICAN: 8 mL/min — AB (ref >60)
Glucose, Bld: 97 mg/dL (ref 70–99)
PHOSPHORUS: 4.2 mg/dL (ref 2.5–4.6)
Potassium: 3.6 mmol/L (ref 3.5–5.1)
SODIUM: 135 mmol/L (ref 135–145)

## 2018-07-29 LAB — CBC
HCT: 28.2 % — ABNORMAL LOW (ref 39.0–52.0)
Hemoglobin: 9.3 g/dL — ABNORMAL LOW (ref 13.0–17.0)
MCH: 29.2 pg (ref 26.0–34.0)
MCHC: 33 g/dL (ref 30.0–36.0)
MCV: 88.4 fL (ref 78.0–100.0)
Platelets: 136 10*3/uL — ABNORMAL LOW (ref 150–400)
RBC: 3.19 MIL/uL — ABNORMAL LOW (ref 4.22–5.81)
RDW: 17.4 % — ABNORMAL HIGH (ref 11.5–15.5)
WBC: 8.5 10*3/uL (ref 4.0–10.5)

## 2018-07-29 MED ORDER — CARVEDILOL 12.5 MG PO TABS
12.5000 mg | ORAL_TABLET | Freq: Two times a day (BID) | ORAL | Status: DC
  Administered 2018-07-29 – 2018-08-01 (×6): 12.5 mg via ORAL
  Filled 2018-07-29 (×6): qty 1

## 2018-07-29 NOTE — Progress Notes (Signed)
Patient ID: Edward Thomas, male   DOB: 1991-08-25, 27 y.o.   MRN: [id number]   Request for tunneled HD catheter to be placed in IR  Scheduled for 9/24 To be at [facility] via ambulance See npo orders and Hold Hep inj 9/24 orders  Will return to [facility] after procedure  RN aware

## 2018-07-29 NOTE — Progress Notes (Signed)
Patient Demographics:    Edward Thomas, is a 27 y.o. male, DOB - May 18, 1991, NOM:[id number]  Admit date - 07/26/2018   Admitting Physician Courage Denton Brick, MD  Outpatient Primary MD for the patient is Patient, No Pcp Per  LOS - 3   Chief Complaint  Patient presents with  . Shortness of Breath        Subjective:    Edward Thomas overall feeling better.  Denies chest pain, no nausea, no vomiting, no shortness of breath or orthopnea at this moment. Reports feeling weak and tired.    Assessment  & Plan :    Principal Problem:   Hypertensive urgency Active Problems:   Dyspnea   Acute CHF (congestive heart failure) (HCC)   Acute kidney injury superimposed on CKD [facility])  Brief Summary:- 27 year old male with long-standing history of untreated hypertension admitted on 07/26/2018 with severely elevated blood pressures 251/154 mmhg and findings consistent with acute heart failure as well as kidney failure (Creatinine > 9), on admission BNP was over 2200, clinical exam and chest x-ray was suggestive of pulmonary edema  Plan:-  1)Hypertensive Emergency--- blood pressure remains elevated, nephrology input appreciated,, on presentation systolic blood pressure was over 962 and diastolic blood pressure was over 150 for now the goal will be to keep systolic blood pressure in the 160-180 range, continue coreg 12.5mg  now; continue amlodipine 10 mg daily, hydralazine  100 mg 3 times daily, continue isosorbide 60 mg daily,,   may use IV labetalol when necessary  Every 4 hours for systolic blood pressure over 200 mmhg. Expecting further improvement with initiation of HD>  2)HFrEF----EF is 20%, patient appears to have acute systolic dysfunction CHF, increase Coreg to 12.5 mg twice daily, continue afterload reduction agents including isosorbide/hydralazine as noted above, unable to use ACE/ARB or Aldactone  due to  kidney concerns,  Follow daily weights and fluid input and output, volume management as per nephrology rec's on diuretics and HD. Will need repeat echo in the next 2-3 months and AICD.   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 Renal ultrasound suggest medical renal kidney disease without obstructive uropathy in the setting of uncontrolled hypertension. creatinine on admission= 9.55 with a BUN of 73, creatinine down to 9.1. avoid nephrotoxic agents/dehydration/hypotension, nephrology consult appreciated, further work-up including hepatitis profile, UA, complement series, aldosterone and renin, uric acid, ANA, ANCA and antistreptolysin titers are pending; plan is for tunneled catheter placement on 9/24 and initiation of dialysis.  Fluids has been discontinued.  Continue to follow recommendations by nephrology service regarding Lasix.  4)Tobacco Abuse--- smoking cessation strongly advised. Nicotine patch decline.   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 IV iron and Epogen as per renal failure recommendations.   6)Hyperuricemia- Uric acid elevated; continue allopurinol. No gout flare.  Disposition/Need for in-Hospital Stay- patient unable to be discharged at this time due to severe renal dysfunction with creatinine above 9, still uncontrolled blood pressure and anticipated initiation  of HD.  Plan is for tunnel catheter placement tomorrow 9/24.  Code Status : Full  Disposition Plan  : home   Consults  :  Nephrology/Cardiology/IR  DVT Prophylaxis  :  Heparin   Lab Results  Component Value Date   PLT 136 (L) 07/29/2018    Inpatient Medications  Scheduled Meds: . allopurinol  200 mg Oral  Daily  . amLODipine  10 mg Oral Daily  . carvedilol  6.25 mg Oral BID WC  . febuxostat  40 mg Oral Daily  . heparin  5,000 Units Subcutaneous Q8H  . hydrALAZINE  100 mg Oral TID  . Influenza vac split quadrivalent PF  0.5 mL Intramuscular Tomorrow-1000  . isosorbide mononitrate  60 mg Oral Daily  . labetalol  20 mg Intravenous Once  . sodium chloride flush  3 mL Intravenous Q12H   Continuous Infusions: . sodium chloride    . furosemide 100 mg (07/29/18 0842)   PRN Meds:.sodium chloride, acetaminophen **OR** acetaminophen, albuterol, labetalol, ondansetron **OR** ondansetron (ZOFRAN) IV, polyethylene glycol, sodium chloride flush, traZODone   Anti-infectives (From admission, onward)   None        Objective:   Vitals:   07/28/18 2200 07/29/18 0500 07/29/18 0653 07/29/18 1437  BP: (!) 176/122  (!) 199/125 (!) 156/110  Pulse: (!) 102  (!) 108 (!) 102  Resp: 16   16  Temp: 98 F (36.7 C)  98.5 F (36.9 C) 98.4 F (36.9 C)  TempSrc: Oral  Oral Oral  SpO2: 99%  97%   Weight:  79.8 kg    Height:        Wt Readings from Last 3 Encounters:  07/29/18 79.8 kg     Intake/Output Summary (Last 24 hours) at 07/29/2018 1659 Last data filed at 07/29/2018 1300 Gross per 24 hour  Intake 1816.28 ml  Output 350 ml  Net 1466.28 ml     Physical Exam General exam: Alert, awake, oriented x 3; denies chest pain, and currently expressed no shortness of breath.  Patient's without orthopnea.  Respiratory system: Improved air movement bilaterally.  No wheezing, no frank crackles on exam. Cardiovascular system:RRR. No rubs or gallops.  Soft systolic ejection murmur appreciated on exam.  No JVD. Gastrointestinal system: Abdomen is nondistended, soft and nontender. No organomegaly or masses felt. Normal bowel sounds heard. Central nervous system: Alert and oriented. No focal neurological deficits. Extremities: No C/C/E, +pedal pulses Skin: No rashes, lesions or ulcers Psychiatry: Judgement  and insight appear normal. Mood & affect appropriate.     Data Review:   Micro Results No results found for this or any previous visit (from the past 240 hour(s)).  Radiology Reports Dg Chest 2 View  Result Date: 07/26/2018 CLINICAL DATA:  Shortness of breath, hemoptysis. EXAM: CHEST - 2 VIEW COMPARISON:  None. FINDINGS: Mild cardiomegaly is noted with central pulmonary vascular congestion. Probable mild bilateral pulmonary edema is noted. No pneumothorax or pleural effusion is noted. Bony thorax is unremarkable. IMPRESSION: Mild cardiomegaly with central pulmonary vascular congestion and probable mild bilateral pulmonary edema. Electronically Signed   By: Marijo Conception, M.D.   On: 07/26/2018 10:11   US Renal  Result Date: 07/26/2018 CLINICAL DATA:  Renal failure. EXAM: RENAL / URINARY TRACT ULTRASOUND COMPLETE COMPARISON:  07/26/2018. FINDINGS: Right Kidney: Length: 10.9 cm. Increased echogenicity. 1.7 cm hyperechoic shadowing density noted the midportion of the right kidney. This could be a calcified renal mass. Large renal stone could also present this fashion. No hydronephrosis visualized.  Left Kidney: Length: 9.4 cm. Increased echogenicity. 1.9 cm complex cystic versus solid mass noted the midportion of the left kidney. No hydronephrosis visualized. Bladder: Appears normal for degree of bladder distention. IMPRESSION: 1. Increased echogenicity both kidneys consistent with chronic medical renal disease. 2. 1.7 cm hyperechoic shadowing density in the midportion of the right kidney. This could represent a calcified renal mass. Large renal stone could present in this fashion. 1.9 cm complex cystic versus solid mass in the midportion of the left kidney. A renal tumor in either kidney cannot be excluded. For further evaluation MRI of the kidneys suggested. Electronically Signed   By: Marcello Moores  Register   On: 07/26/2018 13:57   Nm Pulmonary Vent And Perf (v/q Scan)  Result Date: 07/26/2018 CLINICAL  DATA:  Shortness of Breath EXAM: NUCLEAR MEDICINE VENTILATION - PERFUSION LUNG SCAN VIEWS: Anterior, posterior, left lateral, right lateral, RPO, LPO, RAO, LAO-ventilation and perfusion RADIOPHARMACEUTICALS:  33.0 mCi of Tc-51m DTPA aerosol inhalation and 4.4 mCi Tc85m-MAA IV COMPARISON:  Chest radiograph July 26, 2018 FINDINGS: Ventilation: Radiotracer uptake is homogeneous and symmetric bilaterally. No ventilation defects are evident. Perfusion: Radiotracer uptake is homogeneous and symmetric bilaterally. No perfusion defects are evident. IMPRESSION: No ventilation or perfusion defects. Very low probability of pulmonary embolus. Electronically Signed   By: Lowella Grip III M.D.   On: 07/26/2018 13:33     CBC Recent Labs  Lab 07/26/18 0958 07/27/18 0558 07/28/18 0624 07/29/18 0519  WBC 12.8* 9.8 10.3 8.5  HGB 11.2* 9.3* 9.3* 9.3*  HCT 33.9* 28.1* 28.1* 28.2*  PLT 151 130* 155 136*  MCV 86.7 87.0 88.1 88.4  MCH 28.6 28.8 29.2 29.2  MCHC 33.0 33.1 33.1 33.0  RDW 16.7* 16.7* 17.2* 17.4*  LYMPHSABS 1.4  --   --   --   MONOABS 0.7  --   --   --   EOSABS 0.1  --   --   --   BASOSABS 0.1  --   --   --     Chemistries  Recent Labs  Lab 07/26/18 0958 07/27/18 0558 07/28/18 0624 07/29/18 0519  NA 136 135 133* 135  K 2.6* 2.9* 4.2 3.6  CL 92* 95* 96* 98  CO2 28 27 23 24   GLUCOSE 119* 103* 97 97  BUN 73* 71* 67* 66*  CREATININE 9.55* 9.39* 9.07* 9.18*  CALCIUM 9.2 8.8* 8.9 9.0  AST 25  --   --   --   ALT 30  --   --   --   ALKPHOS 79  --   --   --   BILITOT 1.7*  --   --   --    ------------------------------------------------------------------------------------------------------------------ Recent Labs    07/27/18 0558  FERRITIN 121  TIBC 288  IRON 57   Cardiac Enzymes Recent Labs  Lab 07/26/18 2134  TROPONINI 0.25*   ------------------------------------------------------------------------------------------------------------------    Component Value  Date/Time   BNP 2,260.0 (H) 07/26/2018 6004     Barton Dubois M.D on 07/29/2018 at 4:59 PM  Pager---[phone] Go to [email or web address] - password [email or web address] for contact info  [facility] - Office  [phone]

## 2018-07-29 NOTE — Progress Notes (Signed)
Subjective: Interval History: Patient states that he is feeling better.  He does not have any nausea or vomiting.  Objective: Vital signs in last 24 hours: Temp:  [98 F (36.7 C)-98.5 F (36.9 C)] 98.5 F (36.9 C) (09/23 0653) Pulse Rate:  [102-108] 108 (09/23 0653) Resp:  [16-20] 16 (09/22 2200) BP: (176-199)/(122-125) 199/125 (09/23 0653) SpO2:  [97 %-100 %] 97 % (09/23 0653) Weight:  [79.8 kg-79.9 kg] 79.8 kg (09/23 0500) Weight change:   Intake/Output from previous day: 09/22 0701 - 09/23 0700 In: 2134.8 [P.O.:120; I.V.:1894.8; IV Piggyback:120] Out: 625 [Urine:625] Intake/Output this shift: No intake/output data recorded.  Generally patient is alert and in no apparent distress Chest: Has bibasilar crackles, no wheezing Heart exam revealed regular rate and rhythm no murmur Extremities no edema  Lab Results: Recent Labs    07/28/18 0624 07/29/18 0519  WBC 10.3 8.5  HGB 9.3* 9.3*  HCT 28.1* 28.2*  PLT 155 136*   BMET:  Recent Labs    07/28/18 0624 07/29/18 0519  NA 133* 135  K 4.2 3.6  CL 96* 98  CO2 23 24  GLUCOSE 97 97  BUN 67* 66*  CREATININE 9.07* 9.18*  CALCIUM 8.9 9.0   No results for input(s): PTH in the last 72 hours. Iron Studies:  Recent Labs    07/27/18 0558  IRON 57  TIBC 288  FERRITIN 121    Studies/Results: No results found.  I have reviewed the patient's current medications.  Assessment/Plan: 1] malignant hypertension: Presently patient is on amlodipine ,hydralazine and labetalol.  His blood pressure is better but still high. 2] renal failure: Possibly chronic.  His renal function is worsening.  Patient however remains as symptomatic. 3] hypokalemia: Potassium is normal. 4] anemia: Possibly secondary to chronic renal failure. 5] bone mineral disorder: His calcium and phosphorus is in range 6] left complex renal cyst which is incidentalfinding 9] severe LVH possibly hypertensive.  His ejection fraction is 20 very low..  Patient  is on Lasix he had about 600 cc of urine output.  Presently he denies any difficulty breathing. 10] hyperuricemia: Patient is started on allopurinol. Plan: 1] we will continue with Lasix 2] we will DC IV fluid 3] we will send patient to [facility] to have tunneled catheter placed possibly tomorrow and then will consider starting hemodialysis. 4] we will check his  renal panel in the morning 5] patient lives in [place] and he is interested to make arrangement for him to get dialysis here.   .(!) 199/125      LOS: 3 days   Alasdair Kleve S 07/29/2018,8:30 AM

## 2018-07-29 NOTE — Consult Note (Addendum)
Cardiology Consultation:   Patient ID: Edward Thomas MRN: [id number]; DOB: 03/19/1991  Admit date: 07/26/2018 Date of Consult: 07/29/2018  Primary Care Provider: Patient, No Pcp Per Primary Cardiologist: Kate Sable, MD  new Primary Electrophysiologist:  None    Patient Profile:   Edward Thomas is a 27 y.o. male with a hx of untreated hypertension who is being seen today for the evaluation of severe hypertension and new LV dysfunction at the request of [place].  History of Present Illness:   Edward Thomas is a 27 year old with long-standing untreated hypertension admitted 07/26/2018 with blood pressures of 251/154 acute CHF and AKI creatinine over 9.  BNP was over 2200.  2D echo 07/26/2018 showed severe LVH LVEF 20% with diffuse hypokinesis.  There was akinesis of the basal inferior myocardium.  Restrictive pattern, mild AI and mild MR with severely dilated left atrium.  This PA pressure was 45 mmHg.  Patient is being treated with hydralazine 100 mg 3 times daily, amlodipine 10 mg daily, Imdur 60 mg daily, Coreg 6.25 mg twice daily and labetalol as needed.  Renal is following closely.  Patient says he was on HCTZ in 2005 for HTN but Rx ran out and he thought he was fine. Denies any history of chest pain, dyspnea, dyspnea on exertion, dizziness or presyncope. Walks all day long but no regular exercise. Doesn't work, thinking about going back to school. Smokes 1/2 ppd. No ETOH. Mother at bedside. Spoke with niece on phone.   Past Medical History:  Diagnosis Date  . Heart problem    pt does not remember, was in high school    History reviewed. No pertinent surgical history.   Home Medications:  Prior to Admission medications   Not on File    Inpatient Medications: Scheduled Meds: . allopurinol  200 mg Oral Daily  . amLODipine  10 mg Oral Daily  . carvedilol  6.25 mg Oral BID WC  . febuxostat  40 mg Oral Daily  . heparin  5,000 Units Subcutaneous Q8H  . hydrALAZINE   100 mg Oral TID  . Influenza vac split quadrivalent PF  0.5 mL Intramuscular Tomorrow-1000  . isosorbide mononitrate  60 mg Oral Daily  . labetalol  20 mg Intravenous Once  . sodium chloride flush  3 mL Intravenous Q12H   Continuous Infusions: . sodium chloride    . sodium chloride 75 mL/hr at 07/29/18 3220  . furosemide Stopped (07/28/18 1810)   PRN Meds: sodium chloride, acetaminophen **OR** acetaminophen, albuterol, labetalol, ondansetron **OR** ondansetron (ZOFRAN) IV, polyethylene glycol, sodium chloride flush, traZODone  Allergies:   No Known Allergies  Social History:   Social History   Socioeconomic History  . Marital status: Single    Spouse name: Not on file  . Number of children: Not on file  . Years of education: Not on file  . Highest education level: Not on file  Occupational History  . Not on file  Social Needs  . Financial resource strain: Not on file  . Food insecurity:    Worry: Not on file    Inability: Not on file  . Transportation needs:    Medical: Not on file    Non-medical: Not on file  Tobacco Use  . Smoking status: Current Every Day Smoker  . Smokeless tobacco: Never Used  Substance and Sexual Activity  . Alcohol use: Never    Frequency: Never  . Drug use: Never  . Sexual activity: Not on file  Lifestyle  . Physical  activity:    Days per week: Not on file    Minutes per session: Not on file  . Stress: Not on file  Relationships  . Social connections:    Talks on phone: Not on file    Gets together: Not on file    Attends religious service: Not on file    Active member of club or organization: Not on file    Attends meetings of clubs or organizations: Not on file    Relationship status: Not on file  . Intimate partner violence:    Fear of current or ex partner: Not on file    Emotionally abused: Not on file    Physically abused: Not on file    Forced sexual activity: Not on file  Other Topics Concern  . Not on file  Social  History Narrative  . Not on file    Family History:   History reviewed. No pertinent family history.   ROS:  Please see the history of present illness.  Review of Systems  Constitution: Negative.  HENT: Negative.   Cardiovascular: Negative.   Respiratory: Negative.   Endocrine: Negative.   Hematologic/Lymphatic: Negative.   Musculoskeletal: Negative.   Gastrointestinal: Negative.   Genitourinary: Negative.   Neurological: Negative.     All other ROS reviewed and negative.     Physical Exam/Data:   Vitals:   07/28/18 1410 07/28/18 2200 07/29/18 0500 07/29/18 0653  BP: (!) 178/122 (!) 176/122  (!) 199/125  Pulse: (!) 106 (!) 102  (!) 108  Resp: 20 16    Temp: 98 F (36.7 C) 98 F (36.7 C)  98.5 F (36.9 C)  TempSrc: Oral Oral  Oral  SpO2: 100% 99%  97%  Weight:   79.8 kg   Height:        Intake/Output Summary (Last 24 hours) at 07/29/2018 0755 Last data filed at 07/29/2018 4818 Gross per 24 hour  Intake 2134.81 ml  Output 625 ml  Net 1509.81 ml   Filed Weights   07/26/18 1900 07/28/18 1006 07/29/18 0500  Weight: 79 kg 79.9 kg 79.8 kg   Body mass index is 23.21 kg/m.  General:  Well nourished, well developed, in no acute distress HEENT: normal Lymph: no adenopathy Neck: no JVD Endocrine:  No thryomegaly Vascular: No carotid bruits; FA pulses 2+ bilaterally without bruits  Cardiac:  normal S1, S2; RRR at 110/m  1/6 sys murmur LSB Lungs:  clear to auscultation bilaterally, no wheezing, rhonchi or rales  Abd: soft, nontender, no hepatomegaly  Ext: no edema Musculoskeletal:  No deformities, BUE and BLE strength normal and equal Skin: warm and dry  Neuro:  CNs 2-12 intact, no focal abnormalities noted Psych:  Normal affect   EKG:  The EKG was personally reviewed and demonstrates:  Sinus tachycardia with LVH and reciprocal TWI Telemetry:  Telemetry was personally reviewed and demonstrates:  Sinus tachycardia at 112/m  Relevant CV Studies: 2D echo  07/26/2018 Study Conclusions   - Left ventricle: The cavity size was normal. Wall thickness was   increased in a pattern of severe LVH. The estimated ejection   fraction was 20%. Diffuse hypokinesis. There is akinesis of the   basalinferior myocardium. Doppler parameters are consistent with   restrictive physiology, indicative of decreased left ventricular   diastolic compliance and/or increased left atrial pressure.   Consider hypertrophic, infiltrative, or hypertensive   cardiomyopathy in differential. - Aortic valve: There was mild regurgitation. - Mitral valve: There was mild regurgitation. -  Left atrium: The atrium was severely dilated. - Right ventricle: Systolic function was mildly reduced. - Right atrium: Central venous pressure (est): 8 mm Hg. - Atrial septum: No defect or patent foramen ovale was identified. - Tricuspid valve: There was mild regurgitation. - Pulmonary arteries: Systolic pressure was moderately increased.   PA peak pressure: 45 mm Hg (S). - Pericardium, extracardiac: A trivial pericardial effusion was   identified.    Laboratory Data:  Chemistry Recent Labs  Lab 07/27/18 0558 07/28/18 0624 07/29/18 0519  NA 135 133* 135  K 2.9* 4.2 3.6  CL 95* 96* 98  CO2 27 23 24   GLUCOSE 103* 97 97  BUN 71* 67* 66*  CREATININE 9.39* 9.07* 9.18*  CALCIUM 8.8* 8.9 9.0  GFRNONAA 7* 7* 7*  GFRAA 8* 8* 8*  ANIONGAP 13 14 13     Recent Labs  Lab 07/26/18 0958 07/27/18 0558 07/28/18 0624 07/29/18 0519  PROT 7.4  --   --   --   ALBUMIN 3.8 3.1* 3.1* 3.1*  AST 25  --   --   --   ALT 30  --   --   --   ALKPHOS 79  --   --   --   BILITOT 1.7*  --   --   --    Hematology Recent Labs  Lab 07/27/18 0558 07/28/18 0624 07/29/18 0519  WBC 9.8 10.3 8.5  RBC 3.23* 3.19* 3.19*  HGB 9.3* 9.3* 9.3*  HCT 28.1* 28.1* 28.2*  MCV 87.0 88.1 88.4  MCH 28.8 29.2 29.2  MCHC 33.1 33.1 33.0  RDW 16.7* 17.2* 17.4*  PLT 130* 155 136*   Cardiac Enzymes Recent Labs   Lab 07/26/18 2134  TROPONINI 0.25*    Recent Labs  Lab 07/26/18 1000  TROPIPOC 0.34*    BNP Recent Labs  Lab 07/26/18 0958  BNP 2,260.0*    DDimer  Recent Labs  Lab 07/26/18 0958  DDIMER 1.41*    Radiology/Studies:  Dg Chest 2 View  Result Date: 07/26/2018 CLINICAL DATA:  Shortness of breath, hemoptysis. EXAM: CHEST - 2 VIEW COMPARISON:  None. FINDINGS: Mild cardiomegaly is noted with central pulmonary vascular congestion. Probable mild bilateral pulmonary edema is noted. No pneumothorax or pleural effusion is noted. Bony thorax is unremarkable. IMPRESSION: Mild cardiomegaly with central pulmonary vascular congestion and probable mild bilateral pulmonary edema. Electronically Signed   By: Marijo Conception, M.D.   On: 07/26/2018 10:11   US Renal  Result Date: 07/26/2018 CLINICAL DATA:  Renal failure. EXAM: RENAL / URINARY TRACT ULTRASOUND COMPLETE COMPARISON:  07/26/2018. FINDINGS: Right Kidney: Length: 10.9 cm. Increased echogenicity. 1.7 cm hyperechoic shadowing density noted the midportion of the right kidney. This could be a calcified renal mass. Large renal stone could also present this fashion. No hydronephrosis visualized. Left Kidney: Length: 9.4 cm. Increased echogenicity. 1.9 cm complex cystic versus solid mass noted the midportion of the left kidney. No hydronephrosis visualized. Bladder: Appears normal for degree of bladder distention. IMPRESSION: 1. Increased echogenicity both kidneys consistent with chronic medical renal disease. 2. 1.7 cm hyperechoic shadowing density in the midportion of the right kidney. This could represent a calcified renal mass. Large renal stone could present in this fashion. 1.9 cm complex cystic versus solid mass in the midportion of the left kidney. A renal tumor in either kidney cannot be excluded. For further evaluation MRI of the kidneys suggested. Electronically Signed   By: [place]   On: 07/26/2018 13:57   Nm  Pulmonary Vent And  Perf (v/q Scan)  Result Date: 07/26/2018 CLINICAL DATA:  Shortness of Breath EXAM: NUCLEAR MEDICINE VENTILATION - PERFUSION LUNG SCAN VIEWS: Anterior, posterior, left lateral, right lateral, RPO, LPO, RAO, LAO-ventilation and perfusion RADIOPHARMACEUTICALS:  33.0 mCi of Tc-78m DTPA aerosol inhalation and 4.4 mCi Tc8m-MAA IV COMPARISON:  Chest radiograph July 26, 2018 FINDINGS: Ventilation: Radiotracer uptake is homogeneous and symmetric bilaterally. No ventilation defects are evident. Perfusion: Radiotracer uptake is homogeneous and symmetric bilaterally. No perfusion defects are evident. IMPRESSION: No ventilation or perfusion defects. Very low probability of pulmonary embolus. Electronically Signed   By: Lowella Grip III M.D.   On: 07/26/2018 13:33    Assessment and Plan:   1. Hypertensive emergency with severe untreated hypertension blood pressures of over 250/150 on admission currently 199/125 on hydralazine 100 mg 3 times daily, Imdur 60 mg daily, amlodipine 10 mg daily, Coreg 6.25 mg twice daily.No ACEI/ARB's. 2. 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  Cannot rule out ischemia but suspect it is due to untreated hypertension. Troponins flat and EKG with LVH. 2 gm sodium diet. 3. Acute renal failure with creatinine over 9 followed by Dr. Lowanda Foster to begin dialysis 4. Acute systolic CHF is positive over 6 L since admission BNP was >2200 on admission but no CHF on exam today.  5. Sinus tachycardia at 112/m Coreg just increased yest, but hopefully can titrate this up.  6. Tobacco abuse-smoking cessation recommended.       For questions or updates, please contact [place] Please consult [email or web address] for contact info under     Signed, Ermalinda Barrios, PA-C  07/29/2018 7:55 AM  The patient was seen and examined, and I agree with the history, physical exam, assessment and plan as documented above, with  modifications as noted below. I have also personally reviewed all relevant documentation, old records, labs, and both radiographic and cardiovascular studies. I have also independently interpreted old and new ECG's.  Briefly, this is a 27 year old gentleman with a history of untreated hypertension who was admitted with progressive shortness of breath and found to be in hypertensive emergency as detailed above.  He was initially diagnosed with hypertension in 2004 or 2005 and had been on hydrochlorothiazide until he ran out of his prescription.  He did not routinely follow-up with a PCP.   He became short of breath for about a week prior to admission.  He denies any chest pain.  Initial blood pressures were over 250/150.  He remains hypertensive in spite of being treated with multiple antihypertensive agents as noted below, most recent BP 199/125.    Plans are for transfer to [facility] for tunneled dialysis catheter.    I reviewed his echocardiogram which demonstrates severely reduced left ventricular systolic function, LVEF 32%, diffuse hypokinesis, and restrictive diastolic physiology.  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  I personally reviewed his ECG which is consistent with sinus tachycardia and LVH with consequent repolarization abnormalities.  He is symptomatically stable today.  He is on amlodipine 10 mg, carvedilol 6.25 mg twice daily, IV Lasix, hydralazine 100 mg 3 times daily, Imdur 60 mg, and IV labetalol as needed.  Stage V chronic kidney disease precludes the use of ACE inhibitors, aldosterone antagonists, and angiotensin receptor blockers.  An echocardiogram can be repeated within the next several months after he has undergone dialysis and blood pressure control has been optimized.  No further recommendations at this time.   Kate Sable, MD, [facility]  07/29/2018 9:36 AM

## 2018-07-30 ENCOUNTER — Encounter (HOSPITAL_COMMUNITY): Payer: Self-pay | Admitting: Interventional Radiology

## 2018-07-30 ENCOUNTER — Ambulatory Visit (HOSPITAL_COMMUNITY)
Admit: 2018-07-30 | Discharge: 2018-07-30 | Disposition: A | Discharge status: Home / Self Care | Payer: Medicaid Other | Source: Ambulatory Visit | Attending: Nephrology | Admitting: Nephrology

## 2018-07-30 DIAGNOSIS — N189 Chronic kidney disease, unspecified: Secondary | ICD-10-CM | POA: Insufficient documentation

## 2018-07-30 DIAGNOSIS — N179 Acute kidney failure, unspecified: Secondary | ICD-10-CM | POA: Insufficient documentation

## 2018-07-30 DIAGNOSIS — F17210 Nicotine dependence, cigarettes, uncomplicated: Secondary | ICD-10-CM

## 2018-07-30 DIAGNOSIS — I160 Hypertensive urgency: Secondary | ICD-10-CM

## 2018-07-30 HISTORY — PX: IR FLUORO GUIDE CV LINE RIGHT: IMG2283

## 2018-07-30 HISTORY — PX: IR US GUIDE VASC ACCESS RIGHT: IMG2390

## 2018-07-30 LAB — ANTISTREPTOLYSIN O TITER: ASO: <20 IU/mL (ref 0.0–200.0)

## 2018-07-30 LAB — RENAL FUNCTION PANEL
ALBUMIN: 3.2 g/dL — AB (ref 3.5–5.0)
Anion gap: 13 (ref 5–15)
BUN: 66 mg/dL — AB (ref 6–20)
CALCIUM: 9 mg/dL (ref 8.9–10.3)
CO2: 26 mmol/L (ref 22–32)
CREATININE: 9.75 mg/dL — AB (ref 0.61–1.24)
Chloride: 95 mmol/L — ABNORMAL LOW (ref 98–111)
GFR calc Af Amer: 7 mL/min — ABNORMAL LOW (ref >60)
GFR calc non Af Amer: 6 mL/min — ABNORMAL LOW (ref >60)
GLUCOSE: 104 mg/dL — AB (ref 70–99)
PHOSPHORUS: 5.1 mg/dL — AB (ref 2.5–4.6)
Potassium: 3.3 mmol/L — ABNORMAL LOW (ref 3.5–5.1)
SODIUM: 134 mmol/L — AB (ref 135–145)

## 2018-07-30 LAB — C3 COMPLEMENT: C3 COMPLEMENT: 116 mg/dL (ref 82–167)

## 2018-07-30 LAB — ANTINUCLEAR ANTIBODIES, IFA: ANA Ab, IFA: NEGATIVE

## 2018-07-30 LAB — C4 COMPLEMENT: COMPLEMENT C4, BODY FLUID: 34 mg/dL (ref 14–44)

## 2018-07-30 LAB — COMPLEMENT, TOTAL: Compl, Total (CH50): >60 U/mL (ref 42–999999)

## 2018-07-30 MED ORDER — TUBERCULIN PPD 5 UNIT/0.1ML ID SOLN
5.0000 [IU] | Freq: Once | INTRADERMAL | Status: AC
  Administered 2018-07-30: 5 [IU] via INTRADERMAL
  Filled 2018-07-30: qty 0.1

## 2018-07-30 MED ORDER — HEPARIN SODIUM (PORCINE) 1000 UNIT/ML DIALYSIS
1000.0000 [IU] | INTRAMUSCULAR | Status: DC | PRN
  Filled 2018-07-30: qty 1

## 2018-07-30 MED ORDER — FENTANYL CITRATE (PF) 100 MCG/2ML IJ SOLN
INTRAMUSCULAR | Status: AC
  Filled 2018-07-30: qty 2

## 2018-07-30 MED ORDER — HEPARIN SODIUM (PORCINE) 1000 UNIT/ML IJ SOLN
INTRAMUSCULAR | Status: AC
  Filled 2018-07-30: qty 1

## 2018-07-30 MED ORDER — MIDAZOLAM HCL 2 MG/2ML IJ SOLN
INTRAMUSCULAR | Status: AC
  Filled 2018-07-30: qty 2

## 2018-07-30 MED ORDER — FENTANYL CITRATE (PF) 100 MCG/2ML IJ SOLN
INTRAMUSCULAR | Status: AC | PRN
  Administered 2018-07-30 (×2): 25 ug via INTRAVENOUS
  Administered 2018-07-30: 50 ug via INTRAVENOUS

## 2018-07-30 MED ORDER — SODIUM CHLORIDE 0.9 % IV SOLN: 100.0000 mL | INTRAVENOUS | Status: DC | PRN

## 2018-07-30 MED ORDER — ALTEPLASE 2 MG IJ SOLR
2.0000 mg | Freq: Once | INTRAMUSCULAR | Status: DC | PRN
  Filled 2018-07-30: qty 2

## 2018-07-30 MED ORDER — CEFAZOLIN SODIUM-DEXTROSE 2-4 GM/100ML-% IV SOLN
INTRAVENOUS | Status: AC | PRN
  Administered 2018-07-30: 2 g via INTRAVENOUS

## 2018-07-30 MED ORDER — CHLORHEXIDINE GLUCONATE CLOTH 2 % EX PADS
6.0000 | MEDICATED_PAD | Freq: Every day | CUTANEOUS | Status: DC
  Administered 2018-07-30 – 2018-08-01 (×3): 6 via TOPICAL

## 2018-07-30 MED ORDER — CEFAZOLIN SODIUM-DEXTROSE 2-4 GM/100ML-% IV SOLN
INTRAVENOUS | Status: AC
  Filled 2018-07-30: qty 100

## 2018-07-30 MED ORDER — POTASSIUM CHLORIDE CRYS ER 20 MEQ PO TBCR
40.0000 meq | EXTENDED_RELEASE_TABLET | Freq: Once | ORAL | Status: AC
  Administered 2018-07-30: 40 meq via ORAL
  Filled 2018-07-30: qty 2

## 2018-07-30 MED ORDER — MIDAZOLAM HCL 2 MG/2ML IJ SOLN
INTRAMUSCULAR | Status: AC | PRN
  Administered 2018-07-30 (×2): 0.5 mg via INTRAVENOUS
  Administered 2018-07-30: 1 mg via INTRAVENOUS

## 2018-07-30 MED ORDER — LIDOCAINE-EPINEPHRINE (PF) 1 %-1:200000 IJ SOLN
INTRAMUSCULAR | Status: AC
  Filled 2018-07-30: qty 30

## 2018-07-30 MED ORDER — LIDOCAINE-EPINEPHRINE (PF) 1 %-1:200000 IJ SOLN
INTRAMUSCULAR | Status: AC | PRN
  Administered 2018-07-30: 12 mL

## 2018-07-30 MED ORDER — HEPARIN SODIUM (PORCINE) 1000 UNIT/ML IJ SOLN
INTRAMUSCULAR | Status: AC | PRN
  Administered 2018-07-30: 3800 [IU] via INTRAVENOUS

## 2018-07-30 NOTE — Procedures (Signed)
INITIAL HEMODIALYSIS TREATMENT NOTE:  First ever hemodialysis treatment completed via newly placed right IJ tunneled catheter.  Fresh post-op dressing was not changed, deferred to tomorrow.  2 hour session completed with 1 liter goal met.  All blood was returned.  DBP 100-112; Hydralazine, Coreg, and Lasix were given near end of session.  Report given to Ewing Schlein, RN.  Rockwell Alexandria, RN

## 2018-07-30 NOTE — Progress Notes (Signed)
Chief Complaint: Patient was seen in consultation today for placement of HD catheter at the request of [place]  Referring Physician(s): Fran Lowes  Supervising Physician: Jacqulynn Cadet  Patient Status: Edward Thomas - In-pt  History of Present Illness: Edward Thomas is a 27 y.o. male with acute on chronic renal failure. He is in need of HD and IR is asked to place tunneled HD cath. PMHx, meds, labs, imaging, allergies reviewed. Feels well, no recent fevers, chills, illness. Has been NPO today as directed.   Past Medical History:  Diagnosis Date  . Heart problem    pt does not remember, was in high school    No past surgical history on file.  Allergies: Patient has no known allergies.  Medications: No current facility-administered medications for this encounter.  No current outpatient medications on file.  Facility-Administered Medications Ordered in Other Encounters:  .  [MAR Hold] 0.9 %  sodium chloride infusion, 250 mL, Intravenous, PRN, Denton Brick, Courage, MD .  Doug Sou Hold] acetaminophen (TYLENOL) tablet 650 mg, 650 mg, Oral, Q6H PRN, 650 mg at 07/28/18 1416 **OR** [MAR Hold] acetaminophen (TYLENOL) suppository 650 mg, 650 mg, Rectal, Q6H PRN, Emokpae, Courage, MD .  Doug Sou Hold] albuterol (PROVENTIL) (2.5 MG/3ML) 0.083% nebulizer solution 2.5 mg, 2.5 mg, Nebulization, Q2H PRN, Emokpae, Courage, MD .  Doug Sou Hold] allopurinol (ZYLOPRIM) tablet 200 mg, 200 mg, Oral, Daily, Befekadu, Eli Phillips, MD, 200 mg at 07/30/18 0926 .  [MAR Hold] amLODipine (NORVASC) tablet 10 mg, 10 mg, Oral, Daily, Emokpae, Courage, MD, 10 mg at 07/30/18 0925 .  [MAR Hold] carvedilol (COREG) tablet 12.5 mg, 12.5 mg, Oral, BID WC, Barton Dubois, MD, 12.5 mg at 07/30/18 0925 .  [MAR Hold] Chlorhexidine Gluconate Cloth 2 % PADS 6 each, 6 each, Topical, Q0600, Fran Lowes, MD, 6 each at 07/30/18 0923 .  [MAR Hold] febuxostat (ULORIC) tablet 40 mg, 40 mg, Oral, Daily, Fran Lowes, MD, 40 mg at 07/30/18 0926 .  [MAR Hold] furosemide (LASIX) 100 mg in dextrose 5 % 50 mL IVPB, 100 mg, Intravenous, BID, Denton Brick, Courage, MD, Stopped at 07/30/18 1032 .  [MAR Hold] hydrALAZINE (APRESOLINE) tablet 100 mg, 100 mg, Oral, TID, Emokpae, Courage, MD, 100 mg at 07/30/18 0925 .  [MAR Hold] Influenza vac split quadrivalent PF (FLUARIX) injection 0.5 mL, 0.5 mL, Intramuscular, Tomorrow-1000, Emokpae, Courage, MD .  Doug Sou Hold] isosorbide mononitrate (IMDUR) 24 hr tablet 60 mg, 60 mg, Oral, Daily, Emokpae, Courage, MD, 60 mg at 07/30/18 0925 .  [MAR Hold] labetalol (NORMODYNE,TRANDATE) injection 10 mg, 10 mg, Intravenous, Q4H PRN, Denton Brick, Courage, MD .  Doug Sou Hold] labetalol (NORMODYNE,TRANDATE) injection 20 mg, 20 mg, Intravenous, Once, Emokpae, Courage, MD .  Doug Sou Hold] ondansetron (ZOFRAN) tablet 4 mg, 4 mg, Oral, Q6H PRN **OR** [MAR Hold] ondansetron (ZOFRAN) injection 4 mg, 4 mg, Intravenous, Q6H PRN, Denton Brick, Courage, MD, 4 mg at 07/28/18 0547 .  [MAR Hold] polyethylene glycol (MIRALAX / GLYCOLAX) packet 17 g, 17 g, Oral, Daily PRN, Denton Brick, Courage, MD .  Doug Sou Hold] sodium chloride flush (NS) 0.9 % injection 3 mL, 3 mL, Intravenous, Q12H, Emokpae, Courage, MD, 3 mL at 07/30/18 0944 .  [MAR Hold] sodium chloride flush (NS) 0.9 % injection 3 mL, 3 mL, Intravenous, PRN, Denton Brick, Courage, MD .  Doug Sou Hold] traZODone (DESYREL) tablet 50 mg, 50 mg, Oral, QHS PRN, Denton Brick, Courage, MD, 50 mg at 07/28/18 2223 .  tuberculin injection 5 Units, 5 Units, Intradermal, Once, Fran Lowes, MD, 5 Units at 07/30/18 [phone]  No family history on file.  Social History   Socioeconomic History  . Marital status: Single    Spouse name: Not on file  . Number of children: Not on file  . Years of education: Not on file  . Highest education level: Not on file  Occupational History  . Not on file  Social Needs  . Financial resource strain: Not on file  . Food insecurity:    Worry: Not  on file    Inability: Not on file  . Transportation needs:    Medical: Not on file    Non-medical: Not on file  Tobacco Use  . Smoking status: Current Every Day Smoker  . Smokeless tobacco: Never Used  Substance and Sexual Activity  . Alcohol use: Never    Frequency: Never  . Drug use: Never  . Sexual activity: Not on file  Lifestyle  . Physical activity:    Days per week: Not on file    Minutes per session: Not on file  . Stress: Not on file  Relationships  . Social connections:    Talks on phone: Not on file    Gets together: Not on file    Attends religious service: Not on file    Active member of club or organization: Not on file    Attends meetings of clubs or organizations: Not on file    Relationship status: Not on file  Other Topics Concern  . Not on file  Social History Narrative  . Not on file     Review of Systems: A 12 point ROS discussed and pertinent positives are indicated in the HPI above.  All other systems are negative.  Review of Systems  Vital Signs: There were no vitals taken for this visit.  Physical Exam  Constitutional: He is oriented to person, place, and time. He appears well-developed. No distress.  HENT:  Head: Normocephalic.  Mouth/Throat: Oropharynx is clear and moist.  Neck: Normal range of motion. No JVD present. No tracheal deviation present.  Cardiovascular: Normal rate, regular rhythm and normal heart sounds.  Pulmonary/Chest: Effort normal and breath sounds normal. No respiratory distress.  Neurological: He is alert and oriented to person, place, and time.  Skin: Skin is warm and dry.  Psychiatric: He has a normal mood and affect.    Imaging: Dg Chest 2 View  Result Date: 07/26/2018 CLINICAL DATA:  Shortness of breath, hemoptysis. EXAM: CHEST - 2 VIEW COMPARISON:  None. FINDINGS: Mild cardiomegaly is noted with central pulmonary vascular congestion. Probable mild bilateral pulmonary edema is noted. No pneumothorax or pleural  effusion is noted. Bony thorax is unremarkable. IMPRESSION: Mild cardiomegaly with central pulmonary vascular congestion and probable mild bilateral pulmonary edema. Electronically Signed   By: Marijo Conception, M.D.   On: 07/26/2018 10:11   US Renal  Result Date: 07/26/2018 CLINICAL DATA:  Renal failure. EXAM: RENAL / URINARY TRACT ULTRASOUND COMPLETE COMPARISON:  07/26/2018. FINDINGS: Right Kidney: Length: 10.9 cm. Increased echogenicity. 1.7 cm hyperechoic shadowing density noted the midportion of the right kidney. This could be a calcified renal mass. Large renal stone could also present this fashion. No hydronephrosis visualized. Left Kidney: Length: 9.4 cm. Increased echogenicity. 1.9 cm complex cystic versus solid mass noted the midportion of the left kidney. No hydronephrosis visualized. Bladder: Appears normal for degree of bladder distention. IMPRESSION: 1. Increased echogenicity both kidneys consistent with chronic medical renal disease. 2. 1.7 cm hyperechoic shadowing density in the midportion of the right kidney. This could  represent a calcified renal mass. Large renal stone could present in this fashion. 1.9 cm complex cystic versus solid mass in the midportion of the left kidney. A renal tumor in either kidney cannot be excluded. For further evaluation MRI of the kidneys suggested. Electronically Signed   By: Marcello Moores  Register   On: 07/26/2018 13:57   Nm Pulmonary Vent And Perf (v/q Scan)  Result Date: 07/26/2018 CLINICAL DATA:  Shortness of Breath EXAM: NUCLEAR MEDICINE VENTILATION - PERFUSION LUNG SCAN VIEWS: Anterior, posterior, left lateral, right lateral, RPO, LPO, RAO, LAO-ventilation and perfusion RADIOPHARMACEUTICALS:  33.0 mCi of Tc-44m DTPA aerosol inhalation and 4.4 mCi Tc70m-MAA IV COMPARISON:  Chest radiograph July 26, 2018 FINDINGS: Ventilation: Radiotracer uptake is homogeneous and symmetric bilaterally. No ventilation defects are evident. Perfusion: Radiotracer uptake is  homogeneous and symmetric bilaterally. No perfusion defects are evident. IMPRESSION: No ventilation or perfusion defects. Very low probability of pulmonary embolus. Electronically Signed   By: Lowella Grip III M.D.   On: 07/26/2018 13:33    Labs:  CBC: Recent Labs    07/26/18 0958 07/27/18 0558 07/28/18 0624 07/29/18 0519  WBC 12.8* 9.8 10.3 8.5  HGB 11.2* 9.3* 9.3* 9.3*  HCT 33.9* 28.1* 28.1* 28.2*  PLT 151 130* 155 136*    COAGS: No results for input(s): INR, APTT in the last 8760 hours.  BMP: Recent Labs    07/27/18 0558 07/28/18 0624 07/29/18 0519 07/30/18 0423  NA 135 133* 135 134*  K 2.9* 4.2 3.6 3.3*  CL 95* 96* 98 95*  CO2 27 23 24 26   GLUCOSE 103* 97 97 104*  BUN 71* 67* 66* 66*  CALCIUM 8.8* 8.9 9.0 9.0  CREATININE 9.39* 9.07* 9.18* 9.75*  GFRNONAA 7* 7* 7* 6*  GFRAA 8* 8* 8* 7*    LIVER FUNCTION TESTS: Recent Labs    07/26/18 0958 07/27/18 0558 07/28/18 0624 07/29/18 0519 07/30/18 0423  BILITOT 1.7*  --   --   --   --   AST 25  --   --   --   --   ALT 30  --   --   --   --   ALKPHOS 79  --   --   --   --   PROT 7.4  --   --   --   --   ALBUMIN 3.8 3.1* 3.1* 3.1* 3.2*    TUMOR MARKERS: No results for input(s): AFPTM, CEA, CA199, CHROMGRNA in the last 8760 hours.  Assessment and Plan: AKI on CKD in need of HD For tunneled HD catheter placement Labs ok Risks and benefits of image guided tunneled HD placement was discussed with the patient including, but not limited to bleeding, infection, pneumothorax, or fibrin sheath development and need for additional procedures.  All of the patient's questions were answered, patient is agreeable to proceed. Consent signed and in chart.     Thank you for this interesting consult.  I greatly enjoyed meeting ESTES LEHNER and look forward to participating in their care.  A copy of this report was sent to the requesting provider on this date.  Electronically Signed: Ascencion Dike,  PA-C 07/30/2018, 11:39 AM   I spent a total of 20 minutes in face to face in clinical consultation, greater than 50% of which was counseling/coordinating care for HD cath placement

## 2018-07-30 NOTE — Progress Notes (Signed)
Subjective: Interval History: Patient presently offers no complaints.  His appetite remains good and no difficulty breathing.  Objective: Vital signs in last 24 hours: Temp:  [98.4 F (36.9 C)-98.6 F (37 C)] 98.6 F (37 C) (09/24 0524) Pulse Rate:  [98-103] 103 (09/24 0524) Resp:  [16] 16 (09/23 1437) BP: (156-194)/(108-125) 194/125 (09/24 0524) SpO2:  [100 %] 100 % (09/24 0524) Weight:  [77.2 kg] 77.2 kg (09/24 0524) Weight change: -2.724 kg  Intake/Output from previous day: 09/23 0701 - 09/24 0700 In: 480 [P.O.:480] Out: -  Intake/Output this shift: No intake/output data recorded.  Generally patient is alert and in no apparent distress Chest: Has bibasilar crackles, no wheezing Heart exam revealed regular rate and rhythm no murmur Extremities no edema  Lab Results: Recent Labs    07/28/18 0624 07/29/18 0519  WBC 10.3 8.5  HGB 9.3* 9.3*  HCT 28.1* 28.2*  PLT 155 136*   BMET:  Recent Labs    07/29/18 0519 07/30/18 0423  NA 135 134*  K 3.6 3.3*  CL 98 95*  CO2 24 26  GLUCOSE 97 104*  BUN 66* 66*  CREATININE 9.18* 9.75*  CALCIUM 9.0 9.0   No results for input(s): PTH in the last 72 hours. Iron Studies:  No results for input(s): IRON, TIBC, TRANSFERRIN, FERRITIN in the last 72 hours.  Studies/Results: No results found.  I have reviewed the patient's current medications.  Assessment/Plan: 1] malignant hypertension: Presently patient is on amlodipine ,hydralazine and labetalol.  His blood pressure seems to be better. 2] renal failure: Possibly chronic.  His renal function continued to decline.  Patient however denies any nausea or vomiting.  Appetite remains good. 3] hypokalemia: Potassium is slightly low. 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 5] bone mineral disorder: His calcium and phosphorus remains in range 6] left complex renal cyst which is incidentalfinding 9] severe LVH possibly hypertensive.  Patient presently denies any  difficulty breathing. 10] hyperuricemia: Patient is started on allopurinol. Plan: 1] we will continue with Lasix 2]  patient will go to [facility] for  tunneled catheter placement today. 3] we will dialyze patient today for 2 hours. 4] I discussed with the patient about outpatient dialysis.  At this moment he did make a decision was that he would like to go to [place] or stay here. 5] we will check his CBC and renal panel is a morning. 6] we will give him KCl 40 mEq p.o. 1 dose.   .(!) 194/125      LOS: 4 days   Nashiya Disbrow S 07/30/2018,8:08 AM

## 2018-07-30 NOTE — Progress Notes (Addendum)
Patient Demographics:    Edward Thomas, is a 27 y.o. male, DOB - 1991/08/22, AJG:[id number]  Admit date - 07/26/2018   Admitting Physician Courage Denton Brick, MD  Outpatient Primary MD for the patient is Patient, No Pcp Per  LOS - 4   Chief Complaint  Patient presents with  . Shortness of Breath        Subjective:    Edward Thomas no acute distress. Denies CP, orthopnea, nausea, vomiting or any other complaints. Patient ready for tunneled catheter placement.    Assessment  & Plan :    Principal Problem:   Hypertensive urgency Active Problems:   Dyspnea   Acute CHF (congestive heart failure) (HCC)   Acute kidney injury superimposed on CKD [facility])  Brief Summary:- 27 year old male with long-standing history of untreated hypertension admitted on 07/26/2018 with severely elevated blood pressures 251/154 mmhg and findings consistent with acute heart failure as well as kidney failure (Creatinine > 9), on admission BNP was over 2200, clinical exam and chest x-ray was suggestive of pulmonary edema  Plan:-  1)Hypertensive Emergency--- blood pressure remains elevated, nephrology input appreciated,, on presentation systolic blood pressure was over 355 and diastolic blood pressure was over 150 for now the goal will be to keep systolic blood pressure in the 160-180 range, continue coreg 12.5mg  now; continue amlodipine 10 mg daily, hydralazine  100 mg 3 times daily, continue isosorbide 60 mg daily,,   may use IV labetalol when necessary  Every 4 hours for systolic blood pressure over 200 mmhg. Expecting further improvement with initiation of HD>  2)HFrEF----EF is 20%, patient appears to have acute systolic dysfunction CHF, continue Coreg 12.5 mg twice daily (dose adjusted on 9/23), continue afterload reduction agents including isosorbide/hydralazine as noted above, unable to use ACE/ARB or Aldactone  due to kidney  concerns,  Follow daily weights and fluid input and output, volume management as per nephrology rec's on diuretics and HD. Will need repeat echo in the next 2-3 months and AICD at that time if not improvement in EF seen.   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 Renal ultrasound suggest medical renal kidney disease without obstructive uropathy in the setting of uncontrolled hypertension. creatinine on admission= 9.55 with a BUN of 73, creatinine up to 9.7. Continue avoiding nephrotoxic agents/dehydration/hypotension, nephrology consultation appreciated, further work-up including hepatitis profile, UA, complement series, aldosterone and renin, uric acid, ANA, ANCA and antistreptolysin titers are pending; plan is for tunneled catheter placement later today 9/24 and initiation of dialysis.  Fluids has been discontinued.  Continue to follow recommendations by nephrology service regarding Lasix.  4)Tobacco Abuse--- smoking cessation strongly advised. Nicotine patch decline.   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 IV iron and Epogen as per renal failure recommendations.   6)Hyperuricemia- Uric acid elevated; continue allopurinol. No gout flare.  Disposition/Need for in-Hospital Stay- patient unable to be discharged at this time due to severe renal dysfunction with creatinine above 9,  still uncontrolled blood pressure and anticipated initiation of HD.  Plan is for tunnel catheter placement today (9/24) and then initiation of HD.  Code Status : Full  Disposition Plan  : home   Consults  :  Nephrology/Cardiology/IR  DVT Prophylaxis  :  Heparin   Lab Results  Component Value Date   PLT 136 (L)  07/29/2018    Inpatient Medications  Scheduled Meds: . [MAR Hold] allopurinol  200 mg Oral Daily  . [MAR Hold] amLODipine  10 mg Oral Daily  . [MAR Hold] carvedilol  12.5 mg Oral BID WC  . [MAR Hold] Chlorhexidine Gluconate Cloth  6 each Topical Q0600  . [MAR Hold] febuxostat  40 mg Oral Daily  . [MAR Hold] hydrALAZINE  100 mg Oral TID  . [MAR Hold] Influenza vac split quadrivalent PF  0.5 mL Intramuscular Tomorrow-1000  . [MAR Hold] isosorbide mononitrate  60 mg Oral Daily  . [MAR Hold] labetalol  20 mg Intravenous Once  . [MAR Hold] sodium chloride flush  3 mL Intravenous Q12H  . tuberculin  5 Units Intradermal Once   Continuous Infusions: . [MAR Hold] sodium chloride    . [MAR Hold] furosemide Stopped (07/30/18 1032)   PRN Meds:.[MAR Hold] sodium chloride, [MAR Hold] acetaminophen **OR** [MAR Hold] acetaminophen, [MAR Hold] albuterol, [MAR Hold] labetalol, [MAR Hold] ondansetron **OR** [MAR Hold] ondansetron (ZOFRAN) IV, [MAR Hold] polyethylene glycol, [MAR Hold] sodium chloride flush, [MAR Hold] traZODone   Anti-infectives (From admission, onward)   None        Objective:   Vitals:   07/29/18 1437 07/29/18 1808 07/29/18 2107 07/30/18 0524  BP: (!) 156/110 (!) 167/115 (!) 163/108 (!) 194/125  Pulse: (!) 102 100 98 (!) 103  Resp: 16     Temp: 98.4 F (36.9 C)  98.4 F (36.9 C) 98.6 F (37 C)  TempSrc: Oral  Oral Oral  SpO2:   100% 100%  Weight:    77.2 kg  Height:        Wt Readings from Last 3 Encounters:  07/30/18 77.2 kg     Intake/Output Summary (Last 24 hours) at 07/30/2018 1219 Last data filed at 07/30/2018 1100 Gross per 24 hour  Intake 420 ml  Output -  Net 420 ml     Physical Exam General exam: Alert, awake, oriented x 3; no CP, no nausea, no vomiting, no orthopnea.  Respiratory system: no wheezing, no frank crackles; decrease BS at the bases. Cardiovascular system:RRR. Positive soft SEM, no rubs, no gallops. Gastrointestinal system: Abdomen  is nondistended, soft and nontender. No organomegaly or masses felt. Normal bowel sounds heard. Central nervous system: Alert and oriented. No focal neurological deficits. Extremities: No C/C/E, +pedal pulses Skin: No rashes, lesions or ulcers Psychiatry: Judgement and insight appear normal. Mood & affect appropriate.     Data Review:   Micro Results No results found for this or any previous visit (from the past 240 hour(s)).  Radiology Reports Dg Chest 2 View  Result Date: 07/26/2018 CLINICAL DATA:  Shortness of breath, hemoptysis. EXAM: CHEST - 2 VIEW COMPARISON:  None. FINDINGS: Mild cardiomegaly is noted with central pulmonary vascular congestion. Probable mild bilateral pulmonary edema is noted. No pneumothorax or pleural effusion is noted. Bony thorax is unremarkable. IMPRESSION: Mild cardiomegaly with central pulmonary vascular congestion and probable mild bilateral pulmonary edema. Electronically Signed   By: Marijo Conception, M.D.   On: 07/26/2018 10:11   US Renal  Result Date: 07/26/2018 CLINICAL DATA:  Renal failure.  EXAM: RENAL / URINARY TRACT ULTRASOUND COMPLETE COMPARISON:  07/26/2018. FINDINGS: Right Kidney: Length: 10.9 cm. Increased echogenicity. 1.7 cm hyperechoic shadowing density noted the midportion of the right kidney. This could be a calcified renal mass. Large renal stone could also present this fashion. No hydronephrosis visualized. Left Kidney: Length: 9.4 cm. Increased echogenicity. 1.9 cm complex cystic versus solid mass noted the midportion of the left kidney. No hydronephrosis visualized. Bladder: Appears normal for degree of bladder distention. IMPRESSION: 1. Increased echogenicity both kidneys consistent with chronic medical renal disease. 2. 1.7 cm hyperechoic shadowing density in the midportion of the right kidney. This could represent a calcified renal mass. Large renal stone could present in this fashion. 1.9 cm complex cystic versus solid mass in the midportion  of the left kidney. A renal tumor in either kidney cannot be excluded. For further evaluation MRI of the kidneys suggested. Electronically Signed   By: Marcello Moores  Register   On: 07/26/2018 13:57   Nm Pulmonary Vent And Perf (v/q Scan)  Result Date: 07/26/2018 CLINICAL DATA:  Shortness of Breath EXAM: NUCLEAR MEDICINE VENTILATION - PERFUSION LUNG SCAN VIEWS: Anterior, posterior, left lateral, right lateral, RPO, LPO, RAO, LAO-ventilation and perfusion RADIOPHARMACEUTICALS:  33.0 mCi of Tc-2m DTPA aerosol inhalation and 4.4 mCi Tc20m-MAA IV COMPARISON:  Chest radiograph July 26, 2018 FINDINGS: Ventilation: Radiotracer uptake is homogeneous and symmetric bilaterally. No ventilation defects are evident. Perfusion: Radiotracer uptake is homogeneous and symmetric bilaterally. No perfusion defects are evident. IMPRESSION: No ventilation or perfusion defects. Very low probability of pulmonary embolus. Electronically Signed   By: Lowella Grip III M.D.   On: 07/26/2018 13:33     CBC Recent Labs  Lab 07/26/18 0958 07/27/18 0558 07/28/18 0624 07/29/18 0519  WBC 12.8* 9.8 10.3 8.5  HGB 11.2* 9.3* 9.3* 9.3*  HCT 33.9* 28.1* 28.1* 28.2*  PLT 151 130* 155 136*  MCV 86.7 87.0 88.1 88.4  MCH 28.6 28.8 29.2 29.2  MCHC 33.0 33.1 33.1 33.0  RDW 16.7* 16.7* 17.2* 17.4*  LYMPHSABS 1.4  --   --   --   MONOABS 0.7  --   --   --   EOSABS 0.1  --   --   --   BASOSABS 0.1  --   --   --     Chemistries  Recent Labs  Lab 07/26/18 0958 07/27/18 0558 07/28/18 0624 07/29/18 0519 07/30/18 0423  NA 136 135 133* 135 134*  K 2.6* 2.9* 4.2 3.6 3.3*  CL 92* 95* 96* 98 95*  CO2 28 27 23 24 26   GLUCOSE 119* 103* 97 97 104*  BUN 73* 71* 67* 66* 66*  CREATININE 9.55* 9.39* 9.07* 9.18* 9.75*  CALCIUM 9.2 8.8* 8.9 9.0 9.0  AST 25  --   --   --   --   ALT 30  --   --   --   --   ALKPHOS 79  --   --   --   --   BILITOT 1.7*  --   --   --   --    Cardiac Enzymes Recent Labs  Lab 07/26/18 2134  TROPONINI  0.25*   ------------------------------------------------------------------------------------------------------------------    Component Value Date/Time   BNP 2,260.0 (H) 07/26/2018 0814     Barton Dubois M.D on 07/30/2018 at 12:19 PM  Pager---[phone] Go to [email or web address] - password [email or web address] for contact info  [facility] - Office  [phone]

## 2018-07-30 NOTE — Procedures (Signed)
Interventional Radiology Procedure Note  Procedure: Right IJ approach 23 cm Palindrome tunneled HD catheter.   Catheter tips in the upper RA and ready for use.   Complications: None  Estimated Blood Loss: None  Recommendations: - Catheter is ready for use   Signed,  Criselda Peaches, MD

## 2018-07-30 NOTE — Progress Notes (Addendum)
    Consult note reviewed from 07/29/2018. Patient admitted with Hypertensive Emergency, found to have acute on chronic Stage 5 CKD and BP has remained significantly elevated despite multiple medication adjustments. Going to [facility] today for tunneled dialysis catheter placement. From a cardiac perspective, plans are to repeat an echocardiogram in 2-3 months once BP control has been optimized and he has undergone HD.   [facility] will sign off.   Medication Recommendations:  Continue Amlodipine, Coreg, Hydralazine, and Imdur. Coreg can be further titrated as needed (currently at 12.5mg  BID and HR remains in the low-100's to 110's).  Other recommendations (labs, testing, etc): Echocardiogram in 2-3 months. Follow up as an outpatient: 3-4 weeks with Dr. Bronson Ing or APP  Signed, Erma Heritage, PA-C 07/30/2018, 7:50 AM Pager: [phone]

## 2018-07-31 DIAGNOSIS — N186 End stage renal disease: Secondary | ICD-10-CM

## 2018-07-31 DIAGNOSIS — I5021 Acute systolic (congestive) heart failure: Secondary | ICD-10-CM

## 2018-07-31 DIAGNOSIS — I161 Hypertensive emergency: Secondary | ICD-10-CM

## 2018-07-31 DIAGNOSIS — I10 Essential (primary) hypertension: Secondary | ICD-10-CM

## 2018-07-31 LAB — RENAL FUNCTION PANEL
ALBUMIN: 3.5 g/dL (ref 3.5–5.0)
ANION GAP: 14 (ref 5–15)
BUN: 51 mg/dL — AB (ref 6–20)
CO2: 26 mmol/L (ref 22–32)
Calcium: 9.3 mg/dL (ref 8.9–10.3)
Chloride: 96 mmol/L — ABNORMAL LOW (ref 98–111)
Creatinine, Ser: 8.15 mg/dL — ABNORMAL HIGH (ref 0.61–1.24)
GFR calc Af Amer: 9 mL/min — ABNORMAL LOW (ref >60)
GFR calc non Af Amer: 8 mL/min — ABNORMAL LOW (ref >60)
GLUCOSE: 99 mg/dL (ref 70–99)
PHOSPHORUS: 4.6 mg/dL (ref 2.5–4.6)
Potassium: 3.7 mmol/L (ref 3.5–5.1)
SODIUM: 136 mmol/L (ref 135–145)

## 2018-07-31 LAB — CBC
HCT: 32.5 % — ABNORMAL LOW (ref 39.0–52.0)
HEMOGLOBIN: 10.5 g/dL — AB (ref 13.0–17.0)
MCH: 29 pg (ref 26.0–34.0)
MCHC: 32.3 g/dL (ref 30.0–36.0)
MCV: 89.8 fL (ref 78.0–100.0)
Platelets: 178 10*3/uL (ref 150–400)
RBC: 3.62 MIL/uL — AB (ref 4.22–5.81)
RDW: 17.2 % — ABNORMAL HIGH (ref 11.5–15.5)
WBC: 9.7 10*3/uL (ref 4.0–10.5)

## 2018-07-31 LAB — HEPATITIS B SURFACE ANTIBODY, QUANTITATIVE: Hepatitis B-Post: <3.1 m[IU]/mL — ABNORMAL LOW (ref >9.9)

## 2018-07-31 LAB — HEPATITIS B CORE ANTIBODY, TOTAL: Hep B Core Total Ab: NEGATIVE

## 2018-07-31 NOTE — Procedures (Signed)
      HEMODIALYSIS TREATMENT NOTE (HD #2):   2.5 hour heparin-free dialysis completed via right IJ tunneled catheter. Exit site unremarkable. Goal met: 500cc removed without interruption in ultrafiltration.  All blood was returned.  Blood pressures increasing throughout treatment.  Pt due for Hydralazine and Coreg.    Rockwell Alexandria, RN

## 2018-07-31 NOTE — Clinical Social Work Note (Signed)
Initial dialysis start sent to [facility].       Parker Sawatzky, Clydene Pugh, LCSW

## 2018-07-31 NOTE — Clinical Social Work Note (Signed)
Patient will need to remain in [place] for Dialysis due to transportation needs. Patient's clinicals sent to [facility] in [place], [place].     Thirza Pellicano, Clydene Pugh, LCSW

## 2018-07-31 NOTE — Progress Notes (Signed)
PROGRESS NOTE  Edward Thomas ZES:[id number] DOB: 08/27/1991 DOA: 07/26/2018 PCP: Patient, No Pcp Per  Brief History:  27 y.o. male with past medical history relevant for long-standing history of hypertension diagnosed as a teenager (2008) patient has not been compliant with antihypertensives for more than 8 years now presents with progressive dyspnea on exertion, orthopnea, paroxysmal nocturnal dyspnea over the last several days prior to admission. In ED, blood pressure was 251/154, d-dimer was elevated but VQ scan was low for probability for PE.  Clinical exam in the ED and chest x-ray as well as BMP were consistent with fluid overload.  The patient noted to have serum creatinine of 9.55.  Renal ultrasound was negative for hydronephrosis.  Nephrology was consulted to assist with management.  Interventional radiology was consulted, and a tunneled dialysis catheter was placed on 07/30/2018.  The patient underwent his first ever hemodialysis on the evening of 07/30/2018.  Echocardiogram showed EF 20% with diffuse HK and aching in the basal inferior myocardium.  Cardiology was consulted to assist.  They recommended to optimize blood pressure control and to repeat echocardiogram to 3 months.  An outpatient appointment will be set up after discharge.  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  Mild decreased RV function.  PASP 45 -Continue fluid removal on dialysis  -Holding off ACEi due to renal failure  Hypertensive cardiomyopathy -New LV dysfunction found on 2D echo -Troponins are flat -Personally reviewed EKG--no concerning ischemic changes, shows LVH  Anemia of CKD -Erythropoietin per nephrology -Iron saturation 20%, ferritin 121  Hyperuricemia -07/28/2018 uric acid 11.8 -Continue allopurinol  Tobacco abuse -Declined NicoDerm patch -I have discussed tobacco cessation with the patient.  I have counseled the patient regarding the negative impacts of continued tobacco use including but not limited to lung cancer, COPD, and cardiovascular disease.  I have discussed alternatives to tobacco and modalities that may help facilitate tobacco cessation including but not limited to biofeedback, hypnosis, and medications.  Total time spent with tobacco counseling was 4 minutes.  Hypokalemia -repleted     Disposition Plan:   Home once outpt HD chair obtained  Family Communication:   Family at bedside  Consultants:  nephrology Code Status:  FULL   DVT Prophylaxis:  [place] Heparin / [place] Lovenox   Procedures: As Listed in Progress Note Above  Antibiotics: None    Subjective: Patient denies fevers, chills, headache, chest pain, dyspnea, nausea, vomiting, diarrhea, abdominal pain, dysuria, hematuria, hematochezia, and melena.   Objective: Vitals:   07/31/18 1400 07/31/18 1430 07/31/18 1500 07/31/18 1530  BP: (!) 134/93 (!) 166/90 (!) 162/96 (!) 159/100  Pulse: 98 97 96 95  Resp:      Temp:      TempSrc:      SpO2:      Weight:      Height:        Intake/Output Summary (Last 24 hours) at 07/31/2018  1600 Last data filed at 07/31/2018 1535 Gross per 24 hour  Intake 540 ml  Output 1502 ml  Net -962 ml   Weight change: -2.7  kg Exam:   General:  Pt is alert, follows commands appropriately, not in acute distress  HEENT: No icterus, No thrush, No neck mass, [place]/AT  Cardiovascular: RRR, S1/S2, no rubs, no gallops  Respiratory: CTA bilaterally, no wheezing, no crackles, no rhonchi  Abdomen: Soft/+BS, non tender, non distended, no guarding  Extremities: No edema, No lymphangitis, No petechiae, No rashes, no synovitis   Data Reviewed: I have personally reviewed following labs and imaging studies Basic Metabolic Panel: Recent Labs  Lab 07/27/18 0558 07/28/18 0624 07/29/18 0519 07/30/18 0423 07/31/18 0505  NA 135 133* 135 134* 136  K 2.9* 4.2 3.6 3.3* 3.7  CL 95* 96* 98 95* 96*  CO2 _0 GLUCOSE 103* 97 97 104* 99  BUN 71* 67* 66* 66* 51*  CREATININE 9.39* 9.07* 9.18* 9.75* 8.15*  CALCIUM 8.8* 8.9 9.0 9.0 9.3  PHOS 5.1* 3.9 4.2 5.1* 4.6   Liver Function Tests: Recent Labs  Lab 07/26/18 0958 07/27/18 0558 07/28/18 0624 07/29/18 0519 07/30/18 0423 07/31/18 0505  AST 25  --   --   --   --   --   ALT 30  --   --   --   --   --   ALKPHOS 79  --   --   --   --   --   BILITOT 1.7*  --   --   --   --   --   PROT 7.4  --   --   --   --   --   ALBUMIN 3.8 3.1* 3.1* 3.1* 3.2* 3.5   No results for input(s): LIPASE, AMYLASE in the last 168 hours. No results for input(s): AMMONIA in the last 168 hours. Coagulation Profile: No results for input(s): INR, PROTIME in the last 168 hours. CBC: Recent Labs  Lab 07/26/18 0958 07/27/18 0558 07/28/18 0624 07/29/18 0519 07/31/18 0505  WBC 12.8* 9.8 10.3 8.5 9.7  NEUTROABS 10.4*  --   --   --   --   HGB 11.2* 9.3* 9.3* 9.3* 10.5*  HCT 33.9* 28.1* 28.1* 28.2* 32.5*  MCV 86.7 87.0 88.1 88.4 89.8  PLT 151 130* 155 136* 178   Cardiac Enzymes: Recent Labs  Lab 07/26/18 2134  TROPONINI 0.25*   BNP: Invalid input(s): POCBNP CBG: No results for input(s): GLUCAP in the last 168 hours. HbA1C: No results for input(s): HGBA1C in the last 72  hours. Urine analysis:    Component Value Date/Time   COLORURINE YELLOW 07/26/2018 0636   APPEARANCEUR CLEAR 07/26/2018 0636   LABSPEC 1.010 07/26/2018 0636   PHURINE 5.0 07/26/2018 0636   GLUCOSEU NEGATIVE 07/26/2018 0636   HGBUR MODERATE (A) 07/26/2018 0636   BILIRUBINUR NEGATIVE 07/26/2018 0636   KETONESUR NEGATIVE 07/26/2018 0636   PROTEINUR 100 (A) 07/26/2018 0636   NITRITE NEGATIVE 07/26/2018 0636   LEUKOCYTESUR NEGATIVE 07/26/2018 0636   Sepsis Labs: _1 (procalcitonin:4,lacticidven:4) )No results found for this or any previous visit (from the past 240 hour(s)).   Scheduled Meds: . allopurinol  200 mg Oral Daily  . amLODipine  10 mg Oral Daily  . carvedilol  12.5 mg Oral BID WC  . Chlorhexidine Gluconate Cloth  6 each Topical Q0600  . febuxostat  40 mg Oral Daily  . hydrALAZINE  100 mg Oral TID  .  Influenza vac split quadrivalent PF  0.5 mL Intramuscular Tomorrow-1000  . isosorbide mononitrate  60 mg Oral Daily  . labetalol  20 mg Intravenous Once  . sodium chloride flush  3 mL Intravenous Q12H  . tuberculin  5 Units Intradermal Once   Continuous Infusions: . sodium chloride    . sodium chloride    . sodium chloride    . furosemide 100 mg (07/31/18 1408)    Procedures/Studies: Dg Chest 2 View  Result Date: 07/26/2018 CLINICAL DATA:  Shortness of breath, hemoptysis. EXAM: CHEST - 2 VIEW COMPARISON:  None. FINDINGS: Mild cardiomegaly is noted with central pulmonary vascular congestion. Probable mild bilateral pulmonary edema is noted. No pneumothorax or pleural effusion is noted. Bony thorax is unremarkable. IMPRESSION: Mild cardiomegaly with central pulmonary vascular congestion and probable mild bilateral pulmonary edema. Electronically Signed   By: Marijo Conception, M.D.   On: 07/26/2018 10:11   US Renal  Result Date: 07/26/2018 CLINICAL DATA:  Renal failure. EXAM: RENAL / URINARY TRACT ULTRASOUND COMPLETE COMPARISON:  07/26/2018. FINDINGS: Right Kidney:  Length: 10.9 cm. Increased echogenicity. 1.7 cm hyperechoic shadowing density noted the midportion of the right kidney. This could be a calcified renal mass. Large renal stone could also present this fashion. No hydronephrosis visualized. Left Kidney: Length: 9.4 cm. Increased echogenicity. 1.9 cm complex cystic versus solid mass noted the midportion of the left kidney. No hydronephrosis visualized. Bladder: Appears normal for degree of bladder distention. IMPRESSION: 1. Increased echogenicity both kidneys consistent with chronic medical renal disease. 2. 1.7 cm hyperechoic shadowing density in the midportion of the right kidney. This could represent a calcified renal mass. Large renal stone could present in this fashion. 1.9 cm complex cystic versus solid mass in the midportion of the left kidney. A renal tumor in either kidney cannot be excluded. For further evaluation MRI of the kidneys suggested. Electronically Signed   By: Marcello Moores  Register   On: 07/26/2018 13:57   Nm Pulmonary Vent And Perf (v/q Scan)  Result Date: 07/26/2018 CLINICAL DATA:  Shortness of Breath EXAM: NUCLEAR MEDICINE VENTILATION - PERFUSION LUNG SCAN VIEWS: Anterior, posterior, left lateral, right lateral, RPO, LPO, RAO, LAO-ventilation and perfusion RADIOPHARMACEUTICALS:  33.0 mCi of Tc-44mDTPA aerosol inhalation and 4.4 mCi Tc967mAA IV COMPARISON:  Chest radiograph July 26, 2018 FINDINGS: Ventilation: Radiotracer uptake is homogeneous and symmetric bilaterally. No ventilation defects are evident. Perfusion: Radiotracer uptake is homogeneous and symmetric bilaterally. No perfusion defects are evident. IMPRESSION: No ventilation or perfusion defects. Very low probability of pulmonary embolus. Electronically Signed   By: WiLowella GripII M.D.   On: 07/26/2018 13:33   Ir Fluoro Guide Cv Line Right  Result Date: 07/30/2018 INDICATION: [age]ear old male with progressive chronic kidney disease now in need of hemodialysis. He  presents for placement of a tunneled hemodialysis catheter. EXAM: TUNNELED CENTRAL VENOUS HEMODIALYSIS CATHETER PLACEMENT WITH ULTRASOUND AND FLUOROSCOPIC GUIDANCE MEDICATIONS: 2 g Ancef. The antibiotic was given in an appropriate time interval prior to skin puncture. ANESTHESIA/SEDATION: Moderate (conscious) sedation was employed during this procedure. A total of Versed 2 mg and Fentanyl 100 mcg was administered intravenously. Moderate Sedation Time: 20 minutes. The patient's level of consciousness and vital signs were monitored continuously by radiology nursing throughout the procedure under my direct supervision. FLUOROSCOPY TIME:  Fluoroscopy Time: 1 minutes 6 seconds (3 mGy). COMPLICATIONS: None immediate. PROCEDURE: Informed written consent was obtained from the patient after a discussion of the risks, benefits, and alternatives to treatment. Questions regarding the procedure  were encouraged and answered. The right neck and chest were prepped with chlorhexidine in a sterile fashion, and a sterile drape was applied covering the operative field. Maximum barrier sterile technique with sterile gowns and gloves were used for the procedure. A timeout was performed prior to the initiation of the procedure. After creating a small venotomy incision, a micropuncture kit was utilized to access the right internal jugular vein under direct, real-time ultrasound guidance after the overlying soft tissues were anesthetized with 1% lidocaine with epinephrine. Ultrasound image documentation was performed. The microwire was kinked to measure appropriate catheter length. A stiff Glidewire was advanced to the level of the IVC and the micropuncture sheath was exchanged for a peel-away sheath. A palindrome tunneled hemodialysis catheter measuring 23 cm from tip to cuff was tunneled in a retrograde fashion from the anterior chest wall to the venotomy incision. The catheter was then placed through the peel-away sheath with tips  ultimately positioned within the superior aspect of the right atrium. Final catheter positioning was confirmed and documented with a spot radiographic image. The catheter aspirates and flushes normally. The catheter was flushed with appropriate volume heparin dwells. The catheter exit site was secured with a 0-Prolene retention suture. The venotomy incision was closed with Dermabond. Dressings were applied. The patient tolerated the procedure well without immediate post procedural complication. IMPRESSION: Successful placement of 23 cm tip to cuff tunneled hemodialysis catheter via the right internal jugular vein with tips terminating within the superior aspect of the right atrium. The catheter is ready for immediate use. Electronically Signed   By: Jacqulynn Cadet M.D.   On: 07/30/2018 13:59   Ir US Guide Vasc Access Right  Result Date: 07/30/2018 INDICATION: 27 year old male with progressive chronic kidney disease now in need of hemodialysis. He presents for placement of a tunneled hemodialysis catheter. EXAM: TUNNELED CENTRAL VENOUS HEMODIALYSIS CATHETER PLACEMENT WITH ULTRASOUND AND FLUOROSCOPIC GUIDANCE MEDICATIONS: 2 g Ancef. The antibiotic was given in an appropriate time interval prior to skin puncture. ANESTHESIA/SEDATION: Moderate (conscious) sedation was employed during this procedure. A total of Versed 2 mg and Fentanyl 100 mcg was administered intravenously. Moderate Sedation Time: 20 minutes. The patient's level of consciousness and vital signs were monitored continuously by radiology nursing throughout the procedure under my direct supervision. FLUOROSCOPY TIME:  Fluoroscopy Time: 1 minutes 6 seconds (3 mGy). COMPLICATIONS: None immediate. PROCEDURE: Informed written consent was obtained from the patient after a discussion of the risks, benefits, and alternatives to treatment. Questions regarding the procedure were encouraged and answered. The right neck and chest were prepped with chlorhexidine  in a sterile fashion, and a sterile drape was applied covering the operative field. Maximum barrier sterile technique with sterile gowns and gloves were used for the procedure. A timeout was performed prior to the initiation of the procedure. After creating a small venotomy incision, a micropuncture kit was utilized to access the right internal jugular vein under direct, real-time ultrasound guidance after the overlying soft tissues were anesthetized with 1% lidocaine with epinephrine. Ultrasound image documentation was performed. The microwire was kinked to measure appropriate catheter length. A stiff Glidewire was advanced to the level of the IVC and the micropuncture sheath was exchanged for a peel-away sheath. A palindrome tunneled hemodialysis catheter measuring 23 cm from tip to cuff was tunneled in a retrograde fashion from the anterior chest wall to the venotomy incision. The catheter was then placed through the peel-away sheath with tips ultimately positioned within the superior aspect of the right atrium.  Final catheter positioning was confirmed and documented with a spot radiographic image. The catheter aspirates and flushes normally. The catheter was flushed with appropriate volume heparin dwells. The catheter exit site was secured with a 0-Prolene retention suture. The venotomy incision was closed with Dermabond. Dressings were applied. The patient tolerated the procedure well without immediate post procedural complication. IMPRESSION: Successful placement of 23 cm tip to cuff tunneled hemodialysis catheter via the right internal jugular vein with tips terminating within the superior aspect of the right atrium. The catheter is ready for immediate use. Electronically Signed   By: Jacqulynn Cadet M.D.   On: 07/30/2018 13:59    Orson Eva, DO  [facility] Pager [phone]  If 7PM-7AM, please contact night-coverage [email or web address] Password [email or web address] 07/31/2018, 4:00 PM   LOS: 5 days

## 2018-07-31 NOTE — Progress Notes (Signed)
Edward Thomas  MRN: [id number]  DOB/AGE: [birth year] 27 y.o.  Primary Care Physician:Patient, No Pcp Per  Admit date: 07/26/2018  Chief Complaint:  Chief Complaint  Patient presents with  . Shortness of Breath    S-Pt presented on  07/26/2018 with  Chief Complaint  Patient presents with  . Shortness of Breath  .    Pt today feels better  Meds . allopurinol  200 mg Oral Daily  . amLODipine  10 mg Oral Daily  . carvedilol  12.5 mg Oral BID WC  . Chlorhexidine Gluconate Cloth  6 each Topical Q0600  . febuxostat  40 mg Oral Daily  . hydrALAZINE  100 mg Oral TID  . Influenza vac split quadrivalent PF  0.5 mL Intramuscular Tomorrow-1000  . isosorbide mononitrate  60 mg Oral Daily  . labetalol  20 mg Intravenous Once  . sodium chloride flush  3 mL Intravenous Q12H  . tuberculin  5 Units Intradermal Once         Physical Exam: Vital signs in last 24 hours: Temp:  [97.9 F (36.6 C)-98.8 F (37.1 C)] 98.8 F (37.1 C) (09/25 0618) Pulse Rate:  [95-104] 104 (09/25 0618) Resp:  [16-26] 16 (09/24 1815) BP: (152-183)/(99-123) 180/115 (09/25 0618) SpO2:  [90 %-100 %] 100 % (09/25 0618) Weight:  [72.2 kg-74.5 kg] 72.2 kg (09/25 0618) Weight change: -2.7 kg Last BM Date: 07/28/18  Intake/Output from previous day: 09/24 0701 - 09/25 0700 In: 660 [P.O.:480; IV Piggyback:180] Out: 1000  No intake/output data recorded.   Physical Exam: General- pt is awake,alert, oriented to time place and person Resp- No acute REsp distress, CTA B/L NO Rhonchi CVS- S1S2 regular in rate and rhythm GIT- BS+, soft, NT, ND EXT- NO LE Edema, Cyanosis   Lab Results: CBC Recent Labs    07/29/18 0519 07/31/18 0505  WBC 8.5 9.7  HGB 9.3* 10.5*  HCT 28.2* 32.5*  PLT 136* 178    BMET Recent Labs    07/30/18 0423 07/31/18 0505  NA 134* 136  K 3.3* 3.7  CL 95* 96*  CO2 26 26  GLUCOSE 104* 99  BUN 66* 51*  CREATININE 9.75* 8.15*  CALCIUM 9.0 9.3    MICRO No results  found for this or any previous visit (from the past 240 hour(s)).    Lab Results  Component Value Date   CALCIUM 9.3 07/31/2018   PHOS 4.6 07/31/2018    Autoimmune work up-negative    Impression: 1)Renal CKD stage 5=> now ESRD .               CKD since 2004 (Pt has had hx of HTN since then)               CKD secondary to HTN                       Data in favor-Target organ damage of LVH                Progression of CKD rapid in presence of non adherence to medical tx                HD initiated on  9.24.19  2)HTN  Medication- On Calcium Channel Blockers On Alpha and beta Blockers. On Vasodilators.   3)Anemia HGb at goal (9--11)   4)CKD Mineral-Bone Disorder Phosphorus at goal. Calcium is at goal.  5)Urology-Complex renal cyst on imaging Primary MD following  6)Electrolytes Hypokalemic  Hyperaldo?  Hyponatremic  sec to ESRD-inability to get rid of free water   7)Acid base Co2 at goal     Plan:  Will dialyze again today.     Jeiry Birnbaum S 07/31/2018, 9:29 AM

## 2018-08-01 LAB — MPO/PR-3 (ANCA) ANTIBODIES
ANCA Proteinase 3: <3.5 U/mL (ref 0.0–3.5)
Myeloperoxidase Abs: <9 U/mL (ref 0.0–9.0)

## 2018-08-01 MED ORDER — DOXAZOSIN MESYLATE 2 MG PO TABS
2.0000 mg | ORAL_TABLET | Freq: Every day | ORAL | Status: DC
  Administered 2018-08-01 – 2018-08-06 (×5): 2 mg via ORAL
  Filled 2018-08-01 (×5): qty 1

## 2018-08-01 MED ORDER — CARVEDILOL 12.5 MG PO TABS
25.0000 mg | ORAL_TABLET | Freq: Two times a day (BID) | ORAL | Status: DC
  Administered 2018-08-01 – 2018-08-06 (×10): 25 mg via ORAL
  Filled 2018-08-01 (×11): qty 2

## 2018-08-01 MED ORDER — FUROSEMIDE 80 MG PO TABS
80.0000 mg | ORAL_TABLET | Freq: Every day | ORAL | Status: DC
  Administered 2018-08-01 – 2018-08-06 (×6): 80 mg via ORAL
  Filled 2018-08-01 (×4): qty 1
  Filled 2018-08-01: qty 2
  Filled 2018-08-01: qty 1

## 2018-08-01 NOTE — Progress Notes (Addendum)
Subjective: Interval History: Patient states that his appetite is good and no nausea vomiting last 2 days.  He denies any difficulty breathing.  Objective: Vital signs in last 24 hours: Temp:  [98 F (36.7 C)-98.9 F (37.2 C)] 98.9 F (37.2 C) (09/26 0600) Pulse Rate:  [95-108] 106 (09/26 0600) Resp:  [16-18] 16 (09/26 0600) BP: (133-174)/(75-130) 174/130 (09/26 0600) SpO2:  [98 %-100 %] 100 % (09/26 0600) Weight:  [71.8 kg-74.2 kg] 74.2 kg (09/26 0500) Weight change: -2.2 kg  Intake/Output from previous day: 09/25 0701 - 09/26 0700 In: 540 [P.O.:480; IV Piggyback:60] Out: 2802 [Urine:2300] Intake/Output this shift: No intake/output data recorded.  Generally patient is alert and in no apparent distress Chest: Is clear to auscultation, no rales rhonchi or egophony.  No wheezing . Heart exam revealed regular rate and rhythm no murmur Extremities no edema  Lab Results: Recent Labs    07/31/18 0505  WBC 9.7  HGB 10.5*  HCT 32.5*  PLT 178   BMET:  Recent Labs    07/30/18 0423 07/31/18 0505  NA 134* 136  K 3.3* 3.7  CL 95* 96*  CO2 26 26  GLUCOSE 104* 99  BUN 66* 51*  CREATININE 9.75* 8.15*  CALCIUM 9.0 9.3   No results for input(s): PTH in the last 72 hours. Iron Studies:  No results for input(s): IRON, TIBC, TRANSFERRIN, FERRITIN in the last 72 hours.  Studies/Results: Ir Fluoro Guide Cv Line Right  Result Date: 07/30/2018 INDICATION: 27 year old male with progressive chronic kidney disease now in need of hemodialysis. He presents for placement of a tunneled hemodialysis catheter. EXAM: TUNNELED CENTRAL VENOUS HEMODIALYSIS CATHETER PLACEMENT WITH ULTRASOUND AND FLUOROSCOPIC GUIDANCE MEDICATIONS: 2 g Ancef. The antibiotic was given in an appropriate time interval prior to skin puncture. ANESTHESIA/SEDATION: Moderate (conscious) sedation was employed during this procedure. A total of Versed 2 mg and Fentanyl 100 mcg was administered intravenously. Moderate Sedation  Time: 20 minutes. The patient's level of consciousness and vital signs were monitored continuously by radiology nursing throughout the procedure under my direct supervision. FLUOROSCOPY TIME:  Fluoroscopy Time: 1 minutes 6 seconds (3 mGy). COMPLICATIONS: None immediate. PROCEDURE: Informed written consent was obtained from the patient after a discussion of the risks, benefits, and alternatives to treatment. Questions regarding the procedure were encouraged and answered. The right neck and chest were prepped with chlorhexidine in a sterile fashion, and a sterile drape was applied covering the operative field. Maximum barrier sterile technique with sterile gowns and gloves were used for the procedure. A timeout was performed prior to the initiation of the procedure. After creating a small venotomy incision, a micropuncture kit was utilized to access the right internal jugular vein under direct, real-time ultrasound guidance after the overlying soft tissues were anesthetized with 1% lidocaine with epinephrine. Ultrasound image documentation was performed. The microwire was kinked to measure appropriate catheter length. A stiff Glidewire was advanced to the level of the IVC and the micropuncture sheath was exchanged for a peel-away sheath. A palindrome tunneled hemodialysis catheter measuring 23 cm from tip to cuff was tunneled in a retrograde fashion from the anterior chest wall to the venotomy incision. The catheter was then placed through the peel-away sheath with tips ultimately positioned within the superior aspect of the right atrium. Final catheter positioning was confirmed and documented with a spot radiographic image. The catheter aspirates and flushes normally. The catheter was flushed with appropriate volume heparin dwells. The catheter exit site was secured with a 0-Prolene retention suture.  The venotomy incision was closed with Dermabond. Dressings were applied. The patient tolerated the procedure well  without immediate post procedural complication. IMPRESSION: Successful placement of 23 cm tip to cuff tunneled hemodialysis catheter via the right internal jugular vein with tips terminating within the superior aspect of the right atrium. The catheter is ready for immediate use. Electronically Signed   By: Jacqulynn Cadet M.D.   On: 07/30/2018 13:59   Ir US Guide Vasc Access Right  Result Date: 07/30/2018 INDICATION: 27 year old male with progressive chronic kidney disease now in need of hemodialysis. He presents for placement of a tunneled hemodialysis catheter. EXAM: TUNNELED CENTRAL VENOUS HEMODIALYSIS CATHETER PLACEMENT WITH ULTRASOUND AND FLUOROSCOPIC GUIDANCE MEDICATIONS: 2 g Ancef. The antibiotic was given in an appropriate time interval prior to skin puncture. ANESTHESIA/SEDATION: Moderate (conscious) sedation was employed during this procedure. A total of Versed 2 mg and Fentanyl 100 mcg was administered intravenously. Moderate Sedation Time: 20 minutes. The patient's level of consciousness and vital signs were monitored continuously by radiology nursing throughout the procedure under my direct supervision. FLUOROSCOPY TIME:  Fluoroscopy Time: 1 minutes 6 seconds (3 mGy). COMPLICATIONS: None immediate. PROCEDURE: Informed written consent was obtained from the patient after a discussion of the risks, benefits, and alternatives to treatment. Questions regarding the procedure were encouraged and answered. The right neck and chest were prepped with chlorhexidine in a sterile fashion, and a sterile drape was applied covering the operative field. Maximum barrier sterile technique with sterile gowns and gloves were used for the procedure. A timeout was performed prior to the initiation of the procedure. After creating a small venotomy incision, a micropuncture kit was utilized to access the right internal jugular vein under direct, real-time ultrasound guidance after the overlying soft tissues were  anesthetized with 1% lidocaine with epinephrine. Ultrasound image documentation was performed. The microwire was kinked to measure appropriate catheter length. A stiff Glidewire was advanced to the level of the IVC and the micropuncture sheath was exchanged for a peel-away sheath. A palindrome tunneled hemodialysis catheter measuring 23 cm from tip to cuff was tunneled in a retrograde fashion from the anterior chest wall to the venotomy incision. The catheter was then placed through the peel-away sheath with tips ultimately positioned within the superior aspect of the right atrium. Final catheter positioning was confirmed and documented with a spot radiographic image. The catheter aspirates and flushes normally. The catheter was flushed with appropriate volume heparin dwells. The catheter exit site was secured with a 0-Prolene retention suture. The venotomy incision was closed with Dermabond. Dressings were applied. The patient tolerated the procedure well without immediate post procedural complication. IMPRESSION: Successful placement of 23 cm tip to cuff tunneled hemodialysis catheter via the right internal jugular vein with tips terminating within the superior aspect of the right atrium. The catheter is ready for immediate use. Electronically Signed   By: Jacqulynn Cadet M.D.   On: 07/30/2018 13:59    I have reviewed the patient's current medications.  Assessment/Plan: 1] malignant hypertension: Presently patient is on amlodipine ,hydralazine and Carvidelol  His blood pressure is high but much better. 2] renal failure: Patient is started on hemodialysis.  Presently his nausea and vomiting has improved.  Appetite remains good. 3] hypokalemia: Potassium is normal. 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  His hemoglobin is 10..8 which is  within our target goal. 5] bone mineral disorder: His calcium and phosphorus remains in range 6] left complex renal cyst which is incidentalfinding 9]  severe  LVH possibly hypertensive.  Patient presently denies any difficulty breathing. 10] hyperuricemia: Patient is started on allopurinol. Plan: 1] we will DC IV Lasix. 2] we will start him on Lasix 80 mg p.o. once a day 3] we will make arrangement for patient to get dialysis tomorrow for 3-1/2 hours. 4] once an arrangement is made for outpatient dialysis adherence we will patient could be discharged. 5] We will add Cardura 2 mg po once a day  .(!) 174/130      LOS: 6 days   Marthann Abshier S 08/01/2018,8:14 AM

## 2018-08-01 NOTE — Care Management Note (Signed)
Case Management Note  Patient Details  Name: Edward Thomas MRN: [id number] Date of Birth: [birth year]   If discussed at [place] Length of Stay Meetings, dates discussed:  08/01/2018  Additional Comments:  Chez Bulnes, Chauncey Reading, RN 08/01/2018, 1:10 PM

## 2018-08-01 NOTE — Progress Notes (Signed)
PROGRESS NOTE  Edward Thomas LKJ:[id number] DOB: [birth year] DOA: 07/26/2018 PCP: Patient, No Pcp Per  Brief History:  27 y.o.malewith past medical history relevant for long-standing history of hypertension diagnosed as a teenager(2008)patient has not been compliant with antihypertensives for more than8years nowpresents with progressive dyspnea on exertion, orthopnea, paroxysmal nocturnal dyspnea over the last several days prior to admission. In ED,blood pressure was 251/154,d-dimer was elevated but VQ scan was low for probability for PE.  Clinical exam in the ED and chest x-ray as well as BMP were consistent with fluid overload.  The patient noted to have serum creatinine of 9.55.  Renal ultrasound was negative for hydronephrosis.  Nephrology was consulted to assist with management.  Interventional radiology was consulted, and a tunneled dialysis catheter was placed on 07/30/2018.  The patient underwent his first ever hemodialysis on the evening of 07/30/2018.  Echocardiogram showed EF 20% with diffuse HK and aching in the basal inferior myocardium.  Cardiology was consulted to assist.  They recommended to optimize blood pressure control and to repeat echocardiogram to 3 months.  An outpatient appointment will be set up after discharge.  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  Mild decreased RV function.  PASP 45 -Continue fluid removal on dialysis  -Holding off ACEi due to renal failure  Hypertensive cardiomyopathy -New LV dysfunction found on 2D echo -Troponins are flat -Personally reviewed EKG--no concerning ischemic changes, shows LVH  Anemia of CKD -Erythropoietin per nephrology -Iron saturation 20%, ferritin 121  Hyperuricemia -07/28/2018 uric acid 11.8 -Continue allopurinol  Tobacco abuse -Declined NicoDerm patch -I have discussed tobacco cessation with the patient.  I have counseled the patient regarding the negative impacts of continued tobacco use including but not limited to lung cancer, COPD, and cardiovascular disease.  I have discussed alternatives to tobacco and modalities that may help facilitate tobacco cessation including but not limited to biofeedback, hypnosis, and medications.  Total time spent with tobacco counseling was 4 minutes.  Hypokalemia -repleted     Disposition Plan:   Home once outpt HD chair obtained  Family Communication:   Family at bedside updated  Consultants:  nephrology Code Status:  FULL   DVT Prophylaxis:  [place] Heparin /  Lovenox   Procedures: As Listed in Progress Note Above  Antibiotics: None    Subjective: Patient denies fevers, chills, headache, chest pain, dyspnea, nausea, vomiting, diarrhea, abdominal pain, dysuria, hematuria, hematochezia, and melena.   Objective: Vitals:   07/31/18 2321 08/01/18 0500 08/01/18 0600 08/01/18 1409  BP: (!) 151/120  (!) 174/130 (!) 147/112  Pulse: (!) 108  (!) 106 (!) 101  Resp: '18  16 18  ' Temp: 98.7 F (37.1 C)  98.9 F (37.2 C) 98.3 F (36.8 C)  TempSrc: Oral  Oral Oral  SpO2: 100%  100% 100%  Weight:  74.2 kg    Height:  Intake/Output Summary (Last 24 hours) at 08/01/2018 1759 Last data filed at 08/01/2018 1500 Gross per 24 hour  Intake 300 ml  Output 3100 ml  Net -2800 ml   Weight change: -2.2 kg Exam:   General:  Pt is alert, follows commands appropriately, not in acute distress  HEENT: No icterus, No thrush, No neck mass, [place]/AT  Cardiovascular: RRR, S1/S2, no rubs, no gallops  Respiratory: CTA bilaterally, no wheezing, no crackles, no rhonchi  Abdomen: Soft/+BS, non tender, non distended, no guarding  Extremities: No edema, No lymphangitis, No petechiae, No rashes, no synovitis   Data Reviewed: I have personally reviewed following labs and imaging studies Basic Metabolic Panel: Recent Labs  Lab 07/27/18 0558 07/28/18 0624 07/29/18 0519 07/30/18 0423 07/31/18 0505  NA 135 133* 135 134* 136  K 2.9* 4.2 3.6 3.3* 3.7  CL 95* 96* 98 95* 96*  CO2 '27 23 24 26 26  ' GLUCOSE 103* 97 97 104* 99  BUN 71* 67* 66* 66* 51*  CREATININE 9.39* 9.07* 9.18* 9.75* 8.15*  CALCIUM 8.8* 8.9 9.0 9.0 9.3  PHOS 5.1* 3.9 4.2 5.1* 4.6   Liver Function Tests: Recent Labs  Lab 07/26/18 0958 07/27/18 0558 07/28/18 0624 07/29/18 0519 07/30/18 0423 07/31/18 0505  AST 25  --   --   --   --   --   ALT 30  --   --   --   --   --   ALKPHOS 79  --   --   --   --   --   BILITOT 1.7*  --   --   --   --   --   PROT 7.4  --   --   --   --   --   ALBUMIN 3.8 3.1* 3.1* 3.1* 3.2* 3.5   No results for input(s): LIPASE, AMYLASE in the last 168 hours. No results for input(s): AMMONIA in the last 168 hours. Coagulation Profile: No results for input(s): INR, PROTIME in the last 168 hours. CBC: Recent Labs  Lab 07/26/18 0958 07/27/18 0558 07/28/18 0624 07/29/18 0519 07/31/18 0505  WBC 12.8* 9.8 10.3 8.5 9.7  NEUTROABS 10.4*  --   --   --   --   HGB 11.2* 9.3* 9.3* 9.3* 10.5*  HCT 33.9* 28.1* 28.1* 28.2* 32.5*  MCV 86.7 87.0 88.1 88.4 89.8  PLT 151 130* 155 136* 178   Cardiac Enzymes: Recent Labs   Lab 07/26/18 2134  TROPONINI 0.25*   BNP: Invalid input(s): POCBNP CBG: No results for input(s): GLUCAP in the last 168 hours. HbA1C: No results for input(s): HGBA1C in the last 72 hours. Urine analysis:    Component Value Date/Time   COLORURINE YELLOW 07/26/2018 0636   APPEARANCEUR CLEAR 07/26/2018 0636   LABSPEC 1.010 07/26/2018 0636   PHURINE 5.0 07/26/2018 0636   GLUCOSEU NEGATIVE 07/26/2018 0636   HGBUR MODERATE (A) 07/26/2018 0636   BILIRUBINUR NEGATIVE 07/26/2018 0636   KETONESUR NEGATIVE 07/26/2018 0636   PROTEINUR 100 (A) 07/26/2018 0636   NITRITE NEGATIVE 07/26/2018 0636   LEUKOCYTESUR NEGATIVE 07/26/2018 0636   Sepsis Labs: '@LABRCNTIP' (procalcitonin:4,lacticidven:4) )No results found for this or any previous visit (from the past 240 hour(s)).   Scheduled Meds: . allopurinol  200 mg Oral Daily  . amLODipine  10 mg Oral Daily  . carvedilol  25 mg Oral BID WC  . Chlorhexidine Gluconate Cloth  6 each Topical Q0600  . doxazosin  2 mg Oral Daily  . febuxostat  40 mg Oral Daily  . furosemide  80 mg Oral Daily  . hydrALAZINE  100 mg Oral TID  . Influenza vac split quadrivalent PF  0.5 mL Intramuscular Tomorrow-1000  . isosorbide mononitrate  60 mg Oral Daily  . labetalol  20 mg Intravenous Once  . sodium chloride flush  3 mL Intravenous Q12H   Continuous Infusions: . sodium chloride    . sodium chloride    . sodium chloride      Procedures/Studies: Dg Chest 2 View  Result Date: 07/26/2018 CLINICAL DATA:  Shortness of breath, hemoptysis. EXAM: CHEST - 2 VIEW COMPARISON:  None. FINDINGS: Mild cardiomegaly is noted with central pulmonary vascular congestion. Probable mild bilateral pulmonary edema is noted. No pneumothorax or pleural effusion is noted. Bony thorax is unremarkable. IMPRESSION: Mild cardiomegaly with central pulmonary vascular congestion and probable mild bilateral pulmonary edema. Electronically Signed   By: Marijo Conception, M.D.   On: 07/26/2018  10:11   US Renal  Result Date: 07/26/2018 CLINICAL DATA:  Renal failure. EXAM: RENAL / URINARY TRACT ULTRASOUND COMPLETE COMPARISON:  07/26/2018. FINDINGS: Right Kidney: Length: 10.9 cm. Increased echogenicity. 1.7 cm hyperechoic shadowing density noted the midportion of the right kidney. This could be a calcified renal mass. Large renal stone could also present this fashion. No hydronephrosis visualized. Left Kidney: Length: 9.4 cm. Increased echogenicity. 1.9 cm complex cystic versus solid mass noted the midportion of the left kidney. No hydronephrosis visualized. Bladder: Appears normal for degree of bladder distention. IMPRESSION: 1. Increased echogenicity both kidneys consistent with chronic medical renal disease. 2. 1.7 cm hyperechoic shadowing density in the midportion of the right kidney. This could represent a calcified renal mass. Large renal stone could present in this fashion. 1.9 cm complex cystic versus solid mass in the midportion of the left kidney. A renal tumor in either kidney cannot be excluded. For further evaluation MRI of the kidneys suggested. Electronically Signed   By: Marcello Moores  Register   On: 07/26/2018 13:57   Nm Pulmonary Vent And Perf (v/q Scan)  Result Date: 07/26/2018 CLINICAL DATA:  Shortness of Breath EXAM: NUCLEAR MEDICINE VENTILATION - PERFUSION LUNG SCAN VIEWS: Anterior, posterior, left lateral, right lateral, RPO, LPO, RAO, LAO-ventilation and perfusion RADIOPHARMACEUTICALS:  33.0 mCi of Tc-31mDTPA aerosol inhalation and 4.4 mCi Tc929mAA IV COMPARISON:  Chest radiograph July 26, 2018 FINDINGS: Ventilation: Radiotracer uptake is homogeneous and symmetric bilaterally. No ventilation defects are evident. Perfusion: Radiotracer uptake is homogeneous and symmetric bilaterally. No perfusion defects are evident. IMPRESSION: No ventilation or perfusion defects. Very low probability of pulmonary embolus. Electronically Signed   By: WiLowella GripII M.D.   On: 07/26/2018  13:33   Ir Fluoro Guide Cv Line Right  Result Date: 07/30/2018 INDICATION: [age]ear old male with progressive chronic kidney disease now in need of hemodialysis. He presents for placement of a tunneled hemodialysis catheter. EXAM: TUNNELED CENTRAL VENOUS HEMODIALYSIS CATHETER PLACEMENT WITH ULTRASOUND AND FLUOROSCOPIC GUIDANCE MEDICATIONS: 2 g Ancef. The antibiotic was given in an appropriate time interval prior to skin puncture. ANESTHESIA/SEDATION: Moderate (conscious) sedation was employed during this procedure. A total of Versed 2 mg and Fentanyl 100 mcg was administered intravenously. Moderate Sedation Time: 20 minutes. The patient's level of consciousness and vital signs were monitored continuously by radiology nursing throughout the procedure under my direct supervision. FLUOROSCOPY TIME:  Fluoroscopy Time: 1 minutes 6 seconds (3 mGy). COMPLICATIONS: None immediate. PROCEDURE: Informed written consent was obtained from the patient after a discussion of the risks, benefits, and  alternatives to treatment. Questions regarding the procedure were encouraged and answered. The right neck and chest were prepped with chlorhexidine in a sterile fashion, and a sterile drape was applied covering the operative field. Maximum barrier sterile technique with sterile gowns and gloves were used for the procedure. A timeout was performed prior to the initiation of the procedure. After creating a small venotomy incision, a micropuncture kit was utilized to access the right internal jugular vein under direct, real-time ultrasound guidance after the overlying soft tissues were anesthetized with 1% lidocaine with epinephrine. Ultrasound image documentation was performed. The microwire was kinked to measure appropriate catheter length. A stiff Glidewire was advanced to the level of the IVC and the micropuncture sheath was exchanged for a peel-away sheath. A palindrome tunneled hemodialysis catheter measuring 23 cm from tip to  cuff was tunneled in a retrograde fashion from the anterior chest wall to the venotomy incision. The catheter was then placed through the peel-away sheath with tips ultimately positioned within the superior aspect of the right atrium. Final catheter positioning was confirmed and documented with a spot radiographic image. The catheter aspirates and flushes normally. The catheter was flushed with appropriate volume heparin dwells. The catheter exit site was secured with a 0-Prolene retention suture. The venotomy incision was closed with Dermabond. Dressings were applied. The patient tolerated the procedure well without immediate post procedural complication. IMPRESSION: Successful placement of 23 cm tip to cuff tunneled hemodialysis catheter via the right internal jugular vein with tips terminating within the superior aspect of the right atrium. The catheter is ready for immediate use. Electronically Signed   By: Jacqulynn Cadet M.D.   On: 07/30/2018 13:59   Ir US Guide Vasc Access Right  Result Date: 07/30/2018 INDICATION: 27 year old male with progressive chronic kidney disease now in need of hemodialysis. He presents for placement of a tunneled hemodialysis catheter. EXAM: TUNNELED CENTRAL VENOUS HEMODIALYSIS CATHETER PLACEMENT WITH ULTRASOUND AND FLUOROSCOPIC GUIDANCE MEDICATIONS: 2 g Ancef. The antibiotic was given in an appropriate time interval prior to skin puncture. ANESTHESIA/SEDATION: Moderate (conscious) sedation was employed during this procedure. A total of Versed 2 mg and Fentanyl 100 mcg was administered intravenously. Moderate Sedation Time: 20 minutes. The patient's level of consciousness and vital signs were monitored continuously by radiology nursing throughout the procedure under my direct supervision. FLUOROSCOPY TIME:  Fluoroscopy Time: 1 minutes 6 seconds (3 mGy). COMPLICATIONS: None immediate. PROCEDURE: Informed written consent was obtained from the patient after a discussion of the  risks, benefits, and alternatives to treatment. Questions regarding the procedure were encouraged and answered. The right neck and chest were prepped with chlorhexidine in a sterile fashion, and a sterile drape was applied covering the operative field. Maximum barrier sterile technique with sterile gowns and gloves were used for the procedure. A timeout was performed prior to the initiation of the procedure. After creating a small venotomy incision, a micropuncture kit was utilized to access the right internal jugular vein under direct, real-time ultrasound guidance after the overlying soft tissues were anesthetized with 1% lidocaine with epinephrine. Ultrasound image documentation was performed. The microwire was kinked to measure appropriate catheter length. A stiff Glidewire was advanced to the level of the IVC and the micropuncture sheath was exchanged for a peel-away sheath. A palindrome tunneled hemodialysis catheter measuring 23 cm from tip to cuff was tunneled in a retrograde fashion from the anterior chest wall to the venotomy incision. The catheter was then placed through the peel-away sheath with tips ultimately positioned within  the superior aspect of the right atrium. Final catheter positioning was confirmed and documented with a spot radiographic image. The catheter aspirates and flushes normally. The catheter was flushed with appropriate volume heparin dwells. The catheter exit site was secured with a 0-Prolene retention suture. The venotomy incision was closed with Dermabond. Dressings were applied. The patient tolerated the procedure well without immediate post procedural complication. IMPRESSION: Successful placement of 23 cm tip to cuff tunneled hemodialysis catheter via the right internal jugular vein with tips terminating within the superior aspect of the right atrium. The catheter is ready for immediate use. Electronically Signed   By: Jacqulynn Cadet M.D.   On: 07/30/2018 13:59    Orson Eva, DO  [facility] Pager [phone]  If 7PM-7AM, please contact night-coverage [email or web address] Password [email or web address] 08/01/2018, 5:59 PM   LOS: 6 days

## 2018-08-01 NOTE — Clinical Social Work Note (Signed)
[facility] in [place] is completely full.  Lavell Anchors, who patient defers to for decisions states that she would like for patient to use the [facility]. The [place] [facility] is with [facility].    LCSW contacted [facility] who indicated that the could come to that facility but needed to have a Patient Insurability Form completed due to patient not having insurance.   Reta Norgren, Clydene Pugh, LCSW

## 2018-08-02 LAB — CBC
HCT: 34.8 % — ABNORMAL LOW (ref 39.0–52.0)
Hemoglobin: 11.2 g/dL — ABNORMAL LOW (ref 13.0–17.0)
MCH: 28.9 pg (ref 26.0–34.0)
MCHC: 32.2 g/dL (ref 30.0–36.0)
MCV: 89.7 fL (ref 78.0–100.0)
PLATELETS: 199 10*3/uL (ref 150–400)
RBC: 3.88 MIL/uL — ABNORMAL LOW (ref 4.22–5.81)
RDW: 16.1 % — AB (ref 11.5–15.5)
WBC: 10.2 10*3/uL (ref 4.0–10.5)

## 2018-08-02 LAB — RENAL FUNCTION PANEL
ALBUMIN: 3.6 g/dL (ref 3.5–5.0)
Anion gap: 13 (ref 5–15)
BUN: 46 mg/dL — AB (ref 6–20)
CALCIUM: 9.1 mg/dL (ref 8.9–10.3)
CO2: 29 mmol/L (ref 22–32)
CREATININE: 8.26 mg/dL — AB (ref 0.61–1.24)
Chloride: 94 mmol/L — ABNORMAL LOW (ref 98–111)
GFR calc Af Amer: 9 mL/min — ABNORMAL LOW (ref >60)
GFR, EST NON AFRICAN AMERICAN: 8 mL/min — AB (ref >60)
GLUCOSE: 93 mg/dL (ref 70–99)
PHOSPHORUS: 7.4 mg/dL — AB (ref 2.5–4.6)
POTASSIUM: 3.4 mmol/L — AB (ref 3.5–5.1)
SODIUM: 136 mmol/L (ref 135–145)

## 2018-08-02 LAB — ALDOSTERONE + RENIN ACTIVITY W/ RATIO
ALDO / PRA RATIO: 12.4 (ref 0.0–30.0)
ALDOSTERONE: 52.6 ng/dL — AB (ref 0.0–30.0)
PRA LC/MS/MS: 4.227 ng/mL/h (ref 0.167–5.380)

## 2018-08-02 NOTE — Progress Notes (Signed)
Subjective: Interval History: Patient states that he is feeling better.  He does not have any nausea or vomiting.  Appetite is good.  Objective: Vital signs in last 24 hours: Temp:  [98.1 F (36.7 C)-98.3 F (36.8 C)] 98.1 F (36.7 C) (09/27 0654) Pulse Rate:  [101-108] 108 (09/27 0838) Resp:  [18] 18 (09/27 0654) BP: (128-172)/(84-118) 172/118 (09/27 0838) SpO2:  [99 %-100 %] 99 % (09/27 0654) Weight change:   Intake/Output from previous day: 09/26 0701 - 09/27 0700 In: 240 [P.O.:240] Out: 800 [Urine:800] Intake/Output this shift: No intake/output data recorded.  Generally patient is alert and in no apparent distress Chest: Is clear to auscultation. Heart exam revealed regular rate and rhythm no murmur Extremities no edema  Lab Results: Recent Labs    07/31/18 0505 08/02/18 0507  WBC 9.7 10.2  HGB 10.5* 11.2*  HCT 32.5* 34.8*  PLT 178 199   BMET:  Recent Labs    07/31/18 0505 08/02/18 0507  NA 136 136  K 3.7 3.4*  CL 96* 94*  CO2 26 29  GLUCOSE 99 93  BUN 51* 46*  CREATININE 8.15* 8.26*  CALCIUM 9.3 9.1   No results for input(s): PTH in the last 72 hours. Iron Studies:  No results for input(s): IRON, TIBC, TRANSFERRIN, FERRITIN in the last 72 hours.  Studies/Results: No results found.  I have reviewed the patient's current medications.  Assessment/Plan: 1] malignant hypertension: Presently patient is on amlodipine ,hydralazine , Carvidelol and Cardura.  his blood pressure seems reasonably controlled. 2] renal failure: Patient is presently on dialysis.  He does not have any uremic signs and symptoms.  He is waiting for outpatient placement. 3] hypokalemia: Potassium is low. 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  His hemoglobin remains within our target goal. 5] bone mineral disorder: His calcium and phosphorus remains in range 6] left complex renal cyst which is incidentalfinding 9] severe LVH possibly hypertensive.  Patient  presently denies any difficulty breathing. 10] hyperuricemia: Patient is started on allopurinol. Plan: 1] we will make arrangement for patient to get dialysis today for 3-1/2 hours. 2] patient could be discharged to outpatient dialysis unit once arrangement is complete.   .(!) 172/118      LOS: 7 days   Jazariah Teall S 08/02/2018,8:54 AM

## 2018-08-02 NOTE — Progress Notes (Signed)
PROGRESS NOTE  CALEEL KINER SHU:[id number] DOB: [birth year] DOA: 07/26/2018 PCP: Patient, No Pcp Per  Brief History: 27 y.o.malewith past medical history relevant for long-standing history of hypertension diagnosed as a teenager(2008)patient has not been compliant with antihypertensives for more than8years nowpresents with progressive dyspnea on exertion, orthopnea, paroxysmal nocturnal dyspnea over the last several daysprior to admission.In ED,blood pressure was 251/154,d-dimer was elevated but VQ scan was low forprobabilityfor PE.Clinical exam in the ED and chest x-ray as well as BMP were consistent withfluid overload.The patient noted to have serum creatinine of 9.55. Renal ultrasound was negative for hydronephrosis. Nephrology was consulted to assist with management. Interventional radiology was consulted, and a tunneled dialysis catheter was placed on 07/30/2018. The patient underwent his first ever hemodialysis on the evening of 07/30/2018. Echocardiogram showed EF 20% with diffuse HK and aching in the basal inferior myocardium. Cardiology was consulted to assist. They recommended to optimize blood pressure control and to repeat echocardiogram to 3 months. An outpatient appointment will be set up after discharge.  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 Mild decreased RV function. PASP 45 -Continue fluid removal on dialysis -Holding off ACEidue to renal failure -repeat Echo in 3 months  Hypertensive cardiomyopathy -New LV dysfunction found on 2D echo -Troponins are flat -Personally reviewed EKG--no concerning ischemic changes, shows LVH  Anemia of CKD -Erythropoietin per nephrology -Iron saturation 20%, ferritin 121  Hyperuricemia -07/28/2018 uric acid 11.8 -Continue allopurinol  Tobacco abuse -Declined NicoDerm patch -I have discussed tobacco cessation with the patient. I have counseled the patient regarding the negative impacts of continued tobacco use including but not limited to lung cancer, COPD, and cardiovascular disease. I have discussed alternatives to tobacco and modalities that may help facilitate tobacco cessation including but not limited to biofeedback, hypnosis, and medications. Total time spent with tobacco counseling was 4 minutes.  Hypokalemia -repleted     Disposition Plan: Home once outpt HD chair obtained Family Communication: Family at bedside updated  Consultants:nephrology Code Status: FULL   DVT Prophylaxis: [facility] Heparin / [place] Lovenox   Procedures: As Listed in Progress Note Above  Antibiotics: None  Subjective: Patient denies fevers, chills, headache, chest pain, dyspnea, nausea, vomiting, diarrhea, abdominal pain, dysuria, hematuria, hematochezia, and melena.   Objective: Vitals:   08/02/18 1415 08/02/18 1420 08/02/18 1445 08/02/18 1515  BP: 131/76 140/82 (!) 141/84 139/80  Pulse: 99 97 95 94  Resp: 16     Temp: 98 F (36.7 C)     TempSrc: Oral     SpO2: 99%     Weight: 75.8 kg     Height:        Intake/Output Summary (Last 24 hours) at 08/02/2018  1541 Last data filed at 08/02/2018 0839 Gross per 24 hour  Intake 240 ml  Output -  Net 240 ml   Weight change:  Exam:  General:  Pt is alert, follows commands appropriately, not in acute distress  HEENT: No icterus, No thrush, No neck mass, [place]/AT  Cardiovascular: RRR, S1/S2, no rubs, no gallops  Respiratory: CTA bilaterally, no wheezing, no crackles, no rhonchi  Abdomen: Soft/+BS, non tender, non distended, no guarding  Extremities: No edema, No lymphangitis, No petechiae, No rashes, no synovitis   Data Reviewed: I have personally reviewed following labs and imaging studies Basic Metabolic Panel: Recent Labs  Lab 07/28/18 0624 07/29/18 0519 07/30/18 0423 07/31/18 0505 08/02/18 0507  NA 133* 135 134* 136 136  K 4.2 3.6 3.3* 3.7 3.4*  CL 96* 98 95* 96* 94*  CO2 _0 GLUCOSE 97 97 104* 99 93  BUN 67* 66* 66* 51* 46*  CREATININE 9.07* 9.18* 9.75* 8.15* 8.26*  CALCIUM 8.9 9.0 9.0 9.3 9.1  PHOS 3.9 4.2 5.1* 4.6 7.4*   Liver Function Tests: Recent Labs  Lab 07/28/18 0624 07/29/18 0519 07/30/18 0423 07/31/18 0505 08/02/18 0507  ALBUMIN 3.1* 3.1* 3.2* 3.5 3.6   No results for input(s): LIPASE, AMYLASE in the last 168 hours. No results for input(s): AMMONIA in the last 168 hours. Coagulation Profile: No results for input(s): INR, PROTIME in the last 168 hours. CBC: Recent Labs  Lab 07/27/18 0558 07/28/18 0624 07/29/18 0519 07/31/18 0505 08/02/18 0507  WBC 9.8 10.3 8.5 9.7 10.2  HGB 9.3* 9.3* 9.3* 10.5* 11.2*  HCT 28.1* 28.1* 28.2* 32.5* 34.8*  MCV 87.0 88.1 88.4 89.8 89.7  PLT 130* 155 136* 178 199   Cardiac Enzymes: Recent Labs  Lab 07/26/18 2134  TROPONINI 0.25*   BNP: Invalid input(s): POCBNP CBG: No results for input(s): GLUCAP in the last 168 hours. HbA1C: No results for input(s): HGBA1C in the last 72 hours. Urine analysis:    Component Value Date/Time   COLORURINE YELLOW 07/26/2018 0636   APPEARANCEUR CLEAR 07/26/2018  0636   LABSPEC 1.010 07/26/2018 0636   PHURINE 5.0 07/26/2018 0636   GLUCOSEU NEGATIVE 07/26/2018 0636   HGBUR MODERATE (A) 07/26/2018 0636   BILIRUBINUR NEGATIVE 07/26/2018 0636   KETONESUR NEGATIVE 07/26/2018 0636   PROTEINUR 100 (A) 07/26/2018 0636   NITRITE NEGATIVE 07/26/2018 0636   LEUKOCYTESUR NEGATIVE 07/26/2018 0636   Sepsis Labs: _1 (procalcitonin:4,lacticidven:4) )No results found for this or any previous visit (from the past 240 hour(s)).   Scheduled Meds: . allopurinol  200 mg Oral Daily  . amLODipine  10 mg Oral Daily  . carvedilol  25 mg Oral BID WC  . Chlorhexidine Gluconate Cloth  6 each Topical Q0600  . doxazosin  2 mg Oral Daily  . febuxostat  40 mg Oral Daily  . furosemide  80 mg Oral Daily  . hydrALAZINE  100 mg Oral TID  . Influenza vac split quadrivalent PF  0.5 mL Intramuscular Tomorrow-1000  . isosorbide mononitrate  60 mg Oral Daily  . labetalol  20 mg Intravenous Once  . sodium chloride flush  3 mL Intravenous Q12H   Continuous Infusions: . sodium chloride    . sodium chloride    . sodium chloride      Procedures/Studies: Dg Chest 2 View  Result Date: 07/26/2018 CLINICAL DATA:  Shortness of breath, hemoptysis. EXAM: CHEST - 2 VIEW COMPARISON:  None. FINDINGS: Mild cardiomegaly is noted with central pulmonary vascular congestion. Probable mild bilateral pulmonary edema is noted. No pneumothorax or pleural effusion is noted. Bony thorax is unremarkable. IMPRESSION: Mild cardiomegaly with central pulmonary vascular congestion and probable mild bilateral  pulmonary edema. Electronically Signed   By: Marijo Conception, M.D.   On: 07/26/2018 10:11   US Renal  Result Date: 07/26/2018 CLINICAL DATA:  Renal failure. EXAM: RENAL / URINARY TRACT ULTRASOUND COMPLETE COMPARISON:  07/26/2018. FINDINGS: Right Kidney: Length: 10.9 cm. Increased echogenicity. 1.7 cm hyperechoic shadowing density noted the midportion of the right kidney. This could be a  calcified renal mass. Large renal stone could also present this fashion. No hydronephrosis visualized. Left Kidney: Length: 9.4 cm. Increased echogenicity. 1.9 cm complex cystic versus solid mass noted the midportion of the left kidney. No hydronephrosis visualized. Bladder: Appears normal for degree of bladder distention. IMPRESSION: 1. Increased echogenicity both kidneys consistent with chronic medical renal disease. 2. 1.7 cm hyperechoic shadowing density in the midportion of the right kidney. This could represent a calcified renal mass. Large renal stone could present in this fashion. 1.9 cm complex cystic versus solid mass in the midportion of the left kidney. A renal tumor in either kidney cannot be excluded. For further evaluation MRI of the kidneys suggested. Electronically Signed   By: Marcello Moores  Register   On: 07/26/2018 13:57   Nm Pulmonary Vent And Perf (v/q Scan)  Result Date: 07/26/2018 CLINICAL DATA:  Shortness of Breath EXAM: NUCLEAR MEDICINE VENTILATION - PERFUSION LUNG SCAN VIEWS: Anterior, posterior, left lateral, right lateral, RPO, LPO, RAO, LAO-ventilation and perfusion RADIOPHARMACEUTICALS:  33.0 mCi of Tc-58mDTPA aerosol inhalation and 4.4 mCi Tc966mAA IV COMPARISON:  Chest radiograph July 26, 2018 FINDINGS: Ventilation: Radiotracer uptake is homogeneous and symmetric bilaterally. No ventilation defects are evident. Perfusion: Radiotracer uptake is homogeneous and symmetric bilaterally. No perfusion defects are evident. IMPRESSION: No ventilation or perfusion defects. Very low probability of pulmonary embolus. Electronically Signed   By: WiLowella GripII M.D.   On: 07/26/2018 13:33   Ir Fluoro Guide Cv Line Right  Result Date: 07/30/2018 INDICATION: [age]ear old male with progressive chronic kidney disease now in need of hemodialysis. He presents for placement of a tunneled hemodialysis catheter. EXAM: TUNNELED CENTRAL VENOUS HEMODIALYSIS CATHETER PLACEMENT WITH ULTRASOUND AND  FLUOROSCOPIC GUIDANCE MEDICATIONS: 2 g Ancef. The antibiotic was given in an appropriate time interval prior to skin puncture. ANESTHESIA/SEDATION: Moderate (conscious) sedation was employed during this procedure. A total of Versed 2 mg and Fentanyl 100 mcg was administered intravenously. Moderate Sedation Time: 20 minutes. The patient's level of consciousness and vital signs were monitored continuously by radiology nursing throughout the procedure under my direct supervision. FLUOROSCOPY TIME:  Fluoroscopy Time: 1 minutes 6 seconds (3 mGy). COMPLICATIONS: None immediate. PROCEDURE: Informed written consent was obtained from the patient after a discussion of the risks, benefits, and alternatives to treatment. Questions regarding the procedure were encouraged and answered. The right neck and chest were prepped with chlorhexidine in a sterile fashion, and a sterile drape was applied covering the operative field. Maximum barrier sterile technique with sterile gowns and gloves were used for the procedure. A timeout was performed prior to the initiation of the procedure. After creating a small venotomy incision, a micropuncture kit was utilized to access the right internal jugular vein under direct, real-time ultrasound guidance after the overlying soft tissues were anesthetized with 1% lidocaine with epinephrine. Ultrasound image documentation was performed. The microwire was kinked to measure appropriate catheter length. A stiff Glidewire was advanced to the level of the IVC and the micropuncture sheath was exchanged for a peel-away sheath. A palindrome tunneled hemodialysis catheter measuring 23 cm from tip to cuff was tunneled in a retrograde fashion  from the anterior chest wall to the venotomy incision. The catheter was then placed through the peel-away sheath with tips ultimately positioned within the superior aspect of the right atrium. Final catheter positioning was confirmed and documented with a spot  radiographic image. The catheter aspirates and flushes normally. The catheter was flushed with appropriate volume heparin dwells. The catheter exit site was secured with a 0-Prolene retention suture. The venotomy incision was closed with Dermabond. Dressings were applied. The patient tolerated the procedure well without immediate post procedural complication. IMPRESSION: Successful placement of 23 cm tip to cuff tunneled hemodialysis catheter via the right internal jugular vein with tips terminating within the superior aspect of the right atrium. The catheter is ready for immediate use. Electronically Signed   By: Jacqulynn Cadet M.D.   On: 07/30/2018 13:59   Ir US Guide Vasc Access Right  Result Date: 07/30/2018 INDICATION: 27 year old male with progressive chronic kidney disease now in need of hemodialysis. He presents for placement of a tunneled hemodialysis catheter. EXAM: TUNNELED CENTRAL VENOUS HEMODIALYSIS CATHETER PLACEMENT WITH ULTRASOUND AND FLUOROSCOPIC GUIDANCE MEDICATIONS: 2 g Ancef. The antibiotic was given in an appropriate time interval prior to skin puncture. ANESTHESIA/SEDATION: Moderate (conscious) sedation was employed during this procedure. A total of Versed 2 mg and Fentanyl 100 mcg was administered intravenously. Moderate Sedation Time: 20 minutes. The patient's level of consciousness and vital signs were monitored continuously by radiology nursing throughout the procedure under my direct supervision. FLUOROSCOPY TIME:  Fluoroscopy Time: 1 minutes 6 seconds (3 mGy). COMPLICATIONS: None immediate. PROCEDURE: Informed written consent was obtained from the patient after a discussion of the risks, benefits, and alternatives to treatment. Questions regarding the procedure were encouraged and answered. The right neck and chest were prepped with chlorhexidine in a sterile fashion, and a sterile drape was applied covering the operative field. Maximum barrier sterile technique with sterile gowns  and gloves were used for the procedure. A timeout was performed prior to the initiation of the procedure. After creating a small venotomy incision, a micropuncture kit was utilized to access the right internal jugular vein under direct, real-time ultrasound guidance after the overlying soft tissues were anesthetized with 1% lidocaine with epinephrine. Ultrasound image documentation was performed. The microwire was kinked to measure appropriate catheter length. A stiff Glidewire was advanced to the level of the IVC and the micropuncture sheath was exchanged for a peel-away sheath. A palindrome tunneled hemodialysis catheter measuring 23 cm from tip to cuff was tunneled in a retrograde fashion from the anterior chest wall to the venotomy incision. The catheter was then placed through the peel-away sheath with tips ultimately positioned within the superior aspect of the right atrium. Final catheter positioning was confirmed and documented with a spot radiographic image. The catheter aspirates and flushes normally. The catheter was flushed with appropriate volume heparin dwells. The catheter exit site was secured with a 0-Prolene retention suture. The venotomy incision was closed with Dermabond. Dressings were applied. The patient tolerated the procedure well without immediate post procedural complication. IMPRESSION: Successful placement of 23 cm tip to cuff tunneled hemodialysis catheter via the right internal jugular vein with tips terminating within the superior aspect of the right atrium. The catheter is ready for immediate use. Electronically Signed   By: Jacqulynn Cadet M.D.   On: 07/30/2018 13:59    Orson Eva, DO  [facility] Pager [phone]  If 7PM-7AM, please contact night-coverage [email or web address] Password [email or web address] 08/02/2018, 3:41 PM   LOS: 7 days

## 2018-08-02 NOTE — Procedures (Signed)
        HEMODIALYSIS TREATMENT NOTE (HD#3):  3 hours and 20 minutes of dialysis completed via right IJ tunneled catheter. Exit site unremarkable. Goal nearly met: 937cc removed without interruption in ultrafiltration.  Treatment was stopped 10 minutes early due to clotting venous line (despite NS flushes).  Unable to return about 200cc of blood.  Hemodynamically stable throughout HD session.   Rockwell Alexandria, RN

## 2018-08-02 NOTE — Clinical Social Work Note (Signed)
CSW following. Still working on outpt schedule for HD. Communicating with [facility] all day regarding insurability. Faxed additional clinical. No chair assignment as of yet. Pt cannot dc until outpt schedule obtained. Will update MD as soon as schedule received.

## 2018-08-03 NOTE — Progress Notes (Signed)
Subjective: Interval History: Patient offers no complaints.  Is feeling good ready to go home.  Objective: Vital signs in last 24 hours: Temp:  [97.7 F (36.5 C)-98.1 F (36.7 C)] 98.1 F (36.7 C) (09/28 0545) Pulse Rate:  [93-105] 105 (09/28 0827) Resp:  [16] 16 (09/28 0545) BP: (129-151)/(76-101) 137/101 (09/28 0827) SpO2:  [99 %-100 %] 100 % (09/28 0545) Weight:  [71.3 kg-75.8 kg] 71.3 kg (09/28 0545) Weight change:   Intake/Output from previous day: 09/27 0701 - 09/28 0700 In: 240 [P.O.:240] Out: 1540 [Urine:600] Intake/Output this shift: No intake/output data recorded.  Generally patient is alert and in no apparent distress Chest: Is clear to auscultation. Heart exam revealed regular rate and rhythm no murmur Extremities no edema  Lab Results: Recent Labs    08/02/18 0507  WBC 10.2  HGB 11.2*  HCT 34.8*  PLT 199   BMET:  Recent Labs    08/02/18 0507  NA 136  K 3.4*  CL 94*  CO2 29  GLUCOSE 93  BUN 46*  CREATININE 8.26*  CALCIUM 9.1   No results for input(s): PTH in the last 72 hours. Iron Studies:  No results for input(s): IRON, TIBC, TRANSFERRIN, FERRITIN in the last 72 hours.  Studies/Results: No results found.  I have reviewed the patient's current medications.  Assessment/Plan: 1] malignant hypertension: Presently patient is on amlodipine ,hydralazine , Carvidelol and Cardura.  his blood pressure is reasonably controlled. 2] renal failure: He is status post dialysis yesterday.  Presently he does not have any uremic signs and symptoms. 3] hypokalemia: Potassium is low.  Patient was dialyzed with high potassium dialysate. 4] anemia: His hemoglobin remained within the suggested range. 5] bone mineral disorder: His calcium and phosphorus remains in range 6] left complex renal cyst which is incidentalfinding 9] severe LVH possibly hypertensive.  No sign of fluid overload 10] hyperuricemia: On allopurinol. Plan: 1] patient does not need dialysis  today.  Next dialysis would be on Monday. 2] we will check CBC and renal panel in the morning   .(!) 137/101      LOS: 8 days   Margurete Guaman S 08/03/2018,8:49 AM

## 2018-08-03 NOTE — Progress Notes (Signed)
PROGRESS NOTE  Edward Thomas FUX:[id number] DOB: 01/07/1991 DOA: 07/26/2018 PCP: Patient, No Pcp Per  Brief History: 28 y.o.malewith past medical history relevant for long-standing history of hypertension diagnosed as a teenager(2008)patient has not been compliant with antihypertensives for more than8years nowpresents with progressive dyspnea on exertion, orthopnea, paroxysmal nocturnal dyspnea over the last several daysprior to admission.In ED,blood pressure was 251/154,d-dimer was elevated but VQ scan was low forprobabilityfor PE.Clinical exam in the ED and chest x-ray as well as BMP were consistent withfluid overload.The patient noted to have serum creatinine of 9.55. Renal ultrasound was negative for hydronephrosis. Nephrology was consulted to assist with management. Interventional radiology was consulted, and a tunneled dialysis catheter was placed on 07/30/2018. The patient underwent his first ever hemodialysis on the evening of 07/30/2018. Echocardiogram showed EF 20% with diffuse HK and aching in the basal inferior myocardium. Cardiology was consulted to assist. They recommended to optimize blood pressure control and to repeat echocardiogram to 3 months. An outpatient appointment will be set up after discharge.  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 Mild decreased RV function. PASP 45 -Continue fluid removal on dialysis -Holding off ACEidue to renal failure -repeat Echo in 3 months  Hypertensive cardiomyopathy -New LV dysfunction found on 2D echo -Troponins are flat -Personally reviewed EKG--no concerning ischemic changes, shows LVH  Anemia of CKD -Erythropoietin per nephrology -Iron saturation 20%, ferritin 121  Hyperuricemia -07/28/2018 uric acid 11.8 -Continue allopurinol  Tobacco abuse -Declined NicoDerm patch -I have discussed tobacco cessation with the patient. I have counseled the patient regarding the negative impacts of continued tobacco use including but not limited to lung cancer, COPD, and cardiovascular disease. I have discussed alternatives to tobacco and modalities that may help facilitate tobacco cessation including but not limited to biofeedback, hypnosis, and medications. Total time spent with tobacco counseling was 4 minutes.  Hypokalemia -repleted     Disposition Plan: Home once outpt HD chair obtained Family Communication: Family at bedsideupdated  Consultants:nephrology Code Status: FULL   DVT Prophylaxis: [place] Heparin / [place] Lovenox   Procedures: As Listed in Progress Note Above  Antibiotics: None   Subjective Patient denies fevers, chills, headache, chest pain, dyspnea, nausea, vomiting, diarrhea, abdominal pain, dysuria, hematuria, hematochezia, and melena.   Objective: Vitals:   08/02/18 2236 08/03/18 0545 08/03/18 0827 08/03/18 1305  BP: (!) 129/98 (!) 132/99 (!) 137/101 119/77  Pulse: 98 (!) 103 (!) 105 96  Resp: '16 16  18  ' Temp: 97.7 F (36.5 C) 98.1 F (36.7 C)  98.1 F (36.7 C)  TempSrc: Oral Oral  Oral  SpO2: 100% 100%  100%  Weight:  71.3 kg     Height:        Intake/Output Summary (Last 24 hours) at 08/03/2018 1649 Last data filed at 08/03/2018 1200 Gross per 24 hour  Intake 240 ml  Output 1540 ml  Net -1300 ml   Weight change:  Exam:   General:  Pt is alert, follows commands appropriately, not in acute distress  HEENT: No icterus, No thrush, No neck mass, [place]/AT  Cardiovascular: RRR, S1/S2, no rubs, no gallops  Respiratory: CTA bilaterally, no wheezing, no crackles, no rhonchi  Abdomen: Soft/+BS, non tender, non distended, no guarding  Extremities: No edema, No lymphangitis, No petechiae, No rashes, no synovitis   Data Reviewed: I have personally reviewed following labs and imaging studies Basic Metabolic Panel: Recent Labs  Lab 07/28/18 0624 07/29/18 0519 07/30/18 0423 07/31/18 0505 08/02/18 0507  NA 133* 135 134* 136 136  K 4.2 3.6 3.3* 3.7 3.4*  CL 96* 98 95* 96* 94*  CO2 '23 24 26 26 29  ' GLUCOSE 97 97 104* 99 93  BUN 67* 66* 66* 51* 46*  CREATININE 9.07* 9.18* 9.75* 8.15* 8.26*  CALCIUM 8.9 9.0 9.0 9.3 9.1  PHOS 3.9 4.2 5.1* 4.6 7.4*   Liver Function Tests: Recent Labs  Lab 07/28/18 0624 07/29/18 0519 07/30/18 0423 07/31/18 0505 08/02/18 0507  ALBUMIN 3.1* 3.1* 3.2* 3.5 3.6   No results for input(s): LIPASE, AMYLASE in the last 168 hours. No results for input(s): AMMONIA in the last 168 hours. Coagulation Profile: No results for input(s): INR, PROTIME in the last 168 hours. CBC: Recent Labs  Lab 07/28/18 0624 07/29/18 0519 07/31/18 0505 08/02/18 0507  WBC 10.3 8.5 9.7 10.2  HGB 9.3* 9.3* 10.5* 11.2*  HCT 28.1* 28.2* 32.5* 34.8*  MCV 88.1 88.4 89.8 89.7  PLT 155 136* 178 199   Cardiac Enzymes: No results for input(s): CKTOTAL, CKMB, CKMBINDEX, TROPONINI in the last 168 hours. BNP: Invalid input(s): POCBNP CBG: No results for input(s): GLUCAP in the last 168 hours. HbA1C: No results for input(s): HGBA1C in the last 72 hours. Urine analysis:    Component Value Date/Time    COLORURINE YELLOW 07/26/2018 0636   APPEARANCEUR CLEAR 07/26/2018 0636   LABSPEC 1.010 07/26/2018 0636   PHURINE 5.0 07/26/2018 0636   GLUCOSEU NEGATIVE 07/26/2018 0636   HGBUR MODERATE (A) 07/26/2018 0636   BILIRUBINUR NEGATIVE 07/26/2018 0636   KETONESUR NEGATIVE 07/26/2018 0636   PROTEINUR 100 (A) 07/26/2018 0636   NITRITE NEGATIVE 07/26/2018 0636   LEUKOCYTESUR NEGATIVE 07/26/2018 0636   Sepsis Labs: '@LABRCNTIP' (procalcitonin:4,lacticidven:4) )No results found for this or any previous visit (from the past 240 hour(s)).   Scheduled Meds: . allopurinol  200 mg Oral Daily  . amLODipine  10 mg Oral Daily  . carvedilol  25 mg Oral BID WC  . Chlorhexidine Gluconate Cloth  6 each Topical Q0600  . doxazosin  2 mg Oral Daily  . febuxostat  40 mg Oral Daily  . furosemide  80 mg Oral Daily  . hydrALAZINE  100 mg Oral TID  . Influenza vac split quadrivalent PF  0.5 mL Intramuscular Tomorrow-1000  . isosorbide mononitrate  60 mg Oral Daily  . labetalol  20 mg Intravenous Once  . sodium chloride flush  3 mL Intravenous Q12H   Continuous Infusions: . sodium chloride    . sodium chloride    . sodium chloride      Procedures/Studies: Dg Chest 2 View  Result Date: 07/26/2018 CLINICAL DATA:  Shortness of breath, hemoptysis. EXAM: CHEST - 2 VIEW COMPARISON:  None. FINDINGS: Mild cardiomegaly is noted with central pulmonary vascular congestion. Probable mild bilateral pulmonary edema is noted. No pneumothorax or pleural effusion is noted. Bony thorax is unremarkable. IMPRESSION: Mild cardiomegaly with central pulmonary  vascular congestion and probable mild bilateral pulmonary edema. Electronically Signed   By: Marijo Conception, M.D.   On: 07/26/2018 10:11   US Renal  Result Date: 07/26/2018 CLINICAL DATA:  Renal failure. EXAM: RENAL / URINARY TRACT ULTRASOUND COMPLETE COMPARISON:  07/26/2018. FINDINGS: Right Kidney: Length: 10.9 cm. Increased echogenicity. 1.7 cm hyperechoic shadowing density  noted the midportion of the right kidney. This could be a calcified renal mass. Large renal stone could also present this fashion. No hydronephrosis visualized. Left Kidney: Length: 9.4 cm. Increased echogenicity. 1.9 cm complex cystic versus solid mass noted the midportion of the left kidney. No hydronephrosis visualized. Bladder: Appears normal for degree of bladder distention. IMPRESSION: 1. Increased echogenicity both kidneys consistent with chronic medical renal disease. 2. 1.7 cm hyperechoic shadowing density in the midportion of the right kidney. This could represent a calcified renal mass. Large renal stone could present in this fashion. 1.9 cm complex cystic versus solid mass in the midportion of the left kidney. A renal tumor in either kidney cannot be excluded. For further evaluation MRI of the kidneys suggested. Electronically Signed   By: Marcello Moores  Register   On: 07/26/2018 13:57   Nm Pulmonary Vent And Perf (v/q Scan)  Result Date: 07/26/2018 CLINICAL DATA:  Shortness of Breath EXAM: NUCLEAR MEDICINE VENTILATION - PERFUSION LUNG SCAN VIEWS: Anterior, posterior, left lateral, right lateral, RPO, LPO, RAO, LAO-ventilation and perfusion RADIOPHARMACEUTICALS:  33.0 mCi of Tc-34mDTPA aerosol inhalation and 4.4 mCi Tc959mAA IV COMPARISON:  Chest radiograph July 26, 2018 FINDINGS: Ventilation: Radiotracer uptake is homogeneous and symmetric bilaterally. No ventilation defects are evident. Perfusion: Radiotracer uptake is homogeneous and symmetric bilaterally. No perfusion defects are evident. IMPRESSION: No ventilation or perfusion defects. Very low probability of pulmonary embolus. Electronically Signed   By: WiLowella GripII M.D.   On: 07/26/2018 13:33   Ir Fluoro Guide Cv Line Right  Result Date: 07/30/2018 INDICATION: [age]ear old male with progressive chronic kidney disease now in need of hemodialysis. He presents for placement of a tunneled hemodialysis catheter. EXAM: TUNNELED CENTRAL  VENOUS HEMODIALYSIS CATHETER PLACEMENT WITH ULTRASOUND AND FLUOROSCOPIC GUIDANCE MEDICATIONS: 2 g Ancef. The antibiotic was given in an appropriate time interval prior to skin puncture. ANESTHESIA/SEDATION: Moderate (conscious) sedation was employed during this procedure. A total of Versed 2 mg and Fentanyl 100 mcg was administered intravenously. Moderate Sedation Time: 20 minutes. The patient's level of consciousness and vital signs were monitored continuously by radiology nursing throughout the procedure under my direct supervision. FLUOROSCOPY TIME:  Fluoroscopy Time: 1 minutes 6 seconds (3 mGy). COMPLICATIONS: None immediate. PROCEDURE: Informed written consent was obtained from the patient after a discussion of the risks, benefits, and alternatives to treatment. Questions regarding the procedure were encouraged and answered. The right neck and chest were prepped with chlorhexidine in a sterile fashion, and a sterile drape was applied covering the operative field. Maximum barrier sterile technique with sterile gowns and gloves were used for the procedure. A timeout was performed prior to the initiation of the procedure. After creating a small venotomy incision, a micropuncture kit was utilized to access the right internal jugular vein under direct, real-time ultrasound guidance after the overlying soft tissues were anesthetized with 1% lidocaine with epinephrine. Ultrasound image documentation was performed. The microwire was kinked to measure appropriate catheter length. A stiff Glidewire was advanced to the level of the IVC and the micropuncture sheath was exchanged for a peel-away sheath. A palindrome tunneled hemodialysis catheter measuring 23 cm from tip to cuff  was tunneled in a retrograde fashion from the anterior chest wall to the venotomy incision. The catheter was then placed through the peel-away sheath with tips ultimately positioned within the superior aspect of the right atrium. Final catheter  positioning was confirmed and documented with a spot radiographic image. The catheter aspirates and flushes normally. The catheter was flushed with appropriate volume heparin dwells. The catheter exit site was secured with a 0-Prolene retention suture. The venotomy incision was closed with Dermabond. Dressings were applied. The patient tolerated the procedure well without immediate post procedural complication. IMPRESSION: Successful placement of 23 cm tip to cuff tunneled hemodialysis catheter via the right internal jugular vein with tips terminating within the superior aspect of the right atrium. The catheter is ready for immediate use. Electronically Signed   By: Jacqulynn Cadet M.D.   On: 07/30/2018 13:59   Ir US Guide Vasc Access Right  Result Date: 07/30/2018 INDICATION: 27 year old male with progressive chronic kidney disease now in need of hemodialysis. He presents for placement of a tunneled hemodialysis catheter. EXAM: TUNNELED CENTRAL VENOUS HEMODIALYSIS CATHETER PLACEMENT WITH ULTRASOUND AND FLUOROSCOPIC GUIDANCE MEDICATIONS: 2 g Ancef. The antibiotic was given in an appropriate time interval prior to skin puncture. ANESTHESIA/SEDATION: Moderate (conscious) sedation was employed during this procedure. A total of Versed 2 mg and Fentanyl 100 mcg was administered intravenously. Moderate Sedation Time: 20 minutes. The patient's level of consciousness and vital signs were monitored continuously by radiology nursing throughout the procedure under my direct supervision. FLUOROSCOPY TIME:  Fluoroscopy Time: 1 minutes 6 seconds (3 mGy). COMPLICATIONS: None immediate. PROCEDURE: Informed written consent was obtained from the patient after a discussion of the risks, benefits, and alternatives to treatment. Questions regarding the procedure were encouraged and answered. The right neck and chest were prepped with chlorhexidine in a sterile fashion, and a sterile drape was applied covering the operative field.  Maximum barrier sterile technique with sterile gowns and gloves were used for the procedure. A timeout was performed prior to the initiation of the procedure. After creating a small venotomy incision, a micropuncture kit was utilized to access the right internal jugular vein under direct, real-time ultrasound guidance after the overlying soft tissues were anesthetized with 1% lidocaine with epinephrine. Ultrasound image documentation was performed. The microwire was kinked to measure appropriate catheter length. A stiff Glidewire was advanced to the level of the IVC and the micropuncture sheath was exchanged for a peel-away sheath. A palindrome tunneled hemodialysis catheter measuring 23 cm from tip to cuff was tunneled in a retrograde fashion from the anterior chest wall to the venotomy incision. The catheter was then placed through the peel-away sheath with tips ultimately positioned within the superior aspect of the right atrium. Final catheter positioning was confirmed and documented with a spot radiographic image. The catheter aspirates and flushes normally. The catheter was flushed with appropriate volume heparin dwells. The catheter exit site was secured with a 0-Prolene retention suture. The venotomy incision was closed with Dermabond. Dressings were applied. The patient tolerated the procedure well without immediate post procedural complication. IMPRESSION: Successful placement of 23 cm tip to cuff tunneled hemodialysis catheter via the right internal jugular vein with tips terminating within the superior aspect of the right atrium. The catheter is ready for immediate use. Electronically Signed   By: Jacqulynn Cadet M.D.   On: 07/30/2018 13:59    Orson Eva, DO  [facility] Pager [phone]  If 7PM-7AM, please contact night-coverage [email or web address] Password [facility] 08/03/2018, 4:49 PM  LOS: 8 days

## 2018-08-04 DIAGNOSIS — R072 Precordial pain: Secondary | ICD-10-CM

## 2018-08-04 LAB — RENAL FUNCTION PANEL
ANION GAP: 12 (ref 5–15)
Albumin: 3.2 g/dL — ABNORMAL LOW (ref 3.5–5.0)
BUN: 39 mg/dL — ABNORMAL HIGH (ref 6–20)
CO2: 26 mmol/L (ref 22–32)
Calcium: 8.6 mg/dL — ABNORMAL LOW (ref 8.9–10.3)
Chloride: 98 mmol/L (ref 98–111)
Creatinine, Ser: 7.41 mg/dL — ABNORMAL HIGH (ref 0.61–1.24)
GFR calc non Af Amer: 9 mL/min — ABNORMAL LOW (ref >60)
GFR, EST AFRICAN AMERICAN: 10 mL/min — AB (ref >60)
GLUCOSE: 92 mg/dL (ref 70–99)
PHOSPHORUS: 6.9 mg/dL — AB (ref 2.5–4.6)
POTASSIUM: 3.5 mmol/L (ref 3.5–5.1)
Sodium: 136 mmol/L (ref 135–145)

## 2018-08-04 LAB — CBC
HEMATOCRIT: 31.4 % — AB (ref 39.0–52.0)
HEMOGLOBIN: 10.1 g/dL — AB (ref 13.0–17.0)
MCH: 29 pg (ref 26.0–34.0)
MCHC: 32.2 g/dL (ref 30.0–36.0)
MCV: 90.2 fL (ref 78.0–100.0)
Platelets: 204 10*3/uL (ref 150–400)
RBC: 3.48 MIL/uL — AB (ref 4.22–5.81)
RDW: 15.5 % (ref 11.5–15.5)
WBC: 8 10*3/uL (ref 4.0–10.5)

## 2018-08-04 MED ORDER — SEVELAMER CARBONATE 800 MG PO TABS
800.0000 mg | ORAL_TABLET | Freq: Three times a day (TID) | ORAL | Status: DC
  Administered 2018-08-04 – 2018-08-06 (×6): 800 mg via ORAL
  Filled 2018-08-04 (×7): qty 1

## 2018-08-04 NOTE — Progress Notes (Signed)
Subjective: Interval History: No new complaints  Objective: Vital signs in last 24 hours: Temp:  [97.8 F (36.6 C)-99 F (37.2 C)] 99 F (37.2 C) (09/29 0544) Pulse Rate:  [96-105] 105 (09/29 0833) Resp:  [18] 18 (09/29 0544) BP: (109-137)/(71-100) 137/90 (09/29 0833) SpO2:  [100 %] 100 % (09/29 0544) Weight:  [71.9 kg] 71.9 kg (09/29 0500) Weight change: -3.941 kg  Intake/Output from previous day: 09/28 0701 - 09/29 0700 In: 240 [P.O.:240] Out: 600 [Urine:600] Intake/Output this shift: No intake/output data recorded.  Generally patient is alert and in no apparent distress Chest: Is clear to auscultation. Heart exam revealed regular rate and rhythm no murmur Extremities no edema  Lab Results: Recent Labs    08/02/18 0507 08/04/18 0623  WBC 10.2 8.0  HGB 11.2* 10.1*  HCT 34.8* 31.4*  PLT 199 204   BMET:  Recent Labs    08/02/18 0507 08/04/18 0623  NA 136 136  K 3.4* 3.5  CL 94* 98  CO2 29 26  GLUCOSE 93 92  BUN 46* 39*  CREATININE 8.26* 7.41*  CALCIUM 9.1 8.6*   No results for input(s): PTH in the last 72 hours. Iron Studies:  No results for input(s): IRON, TIBC, TRANSFERRIN, FERRITIN in the last 72 hours.  Studies/Results: No results found.  I have reviewed the patient's current medications.  Assessment/Plan: 1] malignant hypertension: His blood pressure is reasonably controlled 2] renal failure: He is status post dialysis on Friday.  Potassium is normal and a symptomatic. 3] anemia: His hemoglobin remained within the suggested range. 4] bone mineral disorder: His calcium  remains in range but his phosphorus is high. 5] left complex renal cyst which is incidentalfinding 6] severe LVH possibly hypertensive.  No sign of fluid overload 7] hyperuricemia: On allopurinol. Plan: 1] patient does not need dialysis today. 2] we will dialyze patient tomorrow for 3 and half hours 3] we will start on Renvela 800 mg 1 tablet p.o. 3 times daily with meals 4]  we will check CBC and renal panel in the morning   .137/90     LOS: 9 days   Kingsley Farace S 08/04/2018,8:59 AM

## 2018-08-04 NOTE — Progress Notes (Signed)
Edward Thomas SRP:[id number] DOB: 16-Apr-1991 DOA: 07/26/2018 PCP: Patient, No Pcp Per  Brief History: 27 y.o.malewith past medical history relevant for long-standing history of hypertension diagnosed as a teenager(2008)patient has not been compliant with antihypertensives for more than8years nowpresents with progressive dyspnea on exertion, orthopnea, paroxysmal nocturnal dyspnea over the last several daysprior to admission.In ED,blood pressure was 251/154,d-dimer was elevated but VQ scan was low forprobabilityfor PE.Clinical exam in the ED and chest x-ray as well as BMP were consistent withfluid overload.The patient noted to have serum creatinine of 9.55. Renal ultrasound was negative for hydronephrosis. Nephrology was consulted to assist with management. Interventional radiology was consulted, and a tunneled dialysis catheter was placed on 07/30/2018. The patient underwent his first ever hemodialysis on the evening of 07/30/2018. Echocardiogram showed EF 20% with diffuse HK and aching in the basal inferior myocardium. Cardiology was consulted to assist. They recommended to optimize blood pressure control and to repeat echocardiogram to 3 months. An outpatient appointment will be set up after discharge.  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 Mild decreased RV function. PASP 45 -Continue fluid removal on dialysis -Holding off ACEidue to renal failure -repeat Echo in 3 months  Hypertensive cardiomyopathy -New LV dysfunction found on 2D echo -Troponins are flat -Personally reviewed EKG--no concerning ischemic changes, shows LVH  Anemia of CKD -Erythropoietin per nephrology -Iron saturation 20%, ferritin 121  Hyperuricemia -07/28/2018 uric acid 11.8 -Continue allopurinol  Tobacco abuse -Declined NicoDerm patch -I have discussed tobacco cessation with the patient. I have counseled the patient regarding the negative impacts of continued tobacco use including but not limited to lung cancer, COPD, and cardiovascular disease. I have discussed alternatives to tobacco and modalities that may help facilitate tobacco cessation including but not limited to biofeedback, hypnosis, and medications. Total time spent with tobacco counseling was 4 minutes.  Hypokalemia -repleted     Disposition Plan: Home once outpt HD chair obtained Family Communication: Family at bedsideupdated  Consultants:nephrology Code Status: FULL   DVT Prophylaxis: [place] Heparin /  Lovenox   Procedures: As Listed in Edward Note Above  Antibiotics: None    Subjective: Patient denies fevers, chills, headache, chest pain, dyspnea, nausea, vomiting, diarrhea, abdominal pain, dysuria, hematuria, hematochezia, and melena.   Objective: Vitals:   08/04/18 0500 08/04/18 0544 08/04/18 0833 08/04/18 1342  BP:  (!) 135/100 137/90 (!) 128/99  Pulse:  (!) 101 (!) 105 97  Resp:  18  18  Temp:  99 F (37.2 C)  (!) 97.4 F (36.3 C)  TempSrc:  Oral  Oral  SpO2:  100%  100%  Weight: 71.9 kg     Height:        Intake/Output  Summary (Last 24 hours) at 08/04/2018 1540 Last data filed at 08/04/2018 0800 Gross per 24 hour  Intake 0 ml  Output 600 ml  Net -600  ml   Weight change: -3.941 kg Exam:   General:  Pt is alert, follows commands appropriately, not in acute distress  HEENT: No icterus, No thrush, No neck mass, [place]/AT  Cardiovascular: RRR, S1/S2, no rubs, no gallops  Respiratory: CTA bilaterally, no wheezing, no crackles, no rhonchi  Abdomen: Soft/+BS, non tender, non distended, no guarding  Extremities: No edema, No lymphangitis, No petechiae, No rashes, no synovitis   Data Reviewed: I have personally reviewed following labs and imaging studies Basic Metabolic Panel: Recent Labs  Lab 07/29/18 0519 07/30/18 0423 07/31/18 0505 08/02/18 0507 08/04/18 0623  NA 135 134* 136 136 136  K 3.6 3.3* 3.7 3.4* 3.5  CL 98 95* 96* 94* 98  CO2 '24 26 26 29 26  ' GLUCOSE 97 104* 99 93 92  BUN 66* 66* 51* 46* 39*  CREATININE 9.18* 9.75* 8.15* 8.26* 7.41*  CALCIUM 9.0 9.0 9.3 9.1 8.6*  PHOS 4.2 5.1* 4.6 7.4* 6.9*   Liver Function Tests: Recent Labs  Lab 07/29/18 0519 07/30/18 0423 07/31/18 0505 08/02/18 0507 08/04/18 0623  ALBUMIN 3.1* 3.2* 3.5 3.6 3.2*   No results for input(s): LIPASE, AMYLASE in the last 168 hours. No results for input(s): AMMONIA in the last 168 hours. Coagulation Profile: No results for input(s): INR, PROTIME in the last 168 hours. CBC: Recent Labs  Lab 07/29/18 0519 07/31/18 0505 08/02/18 0507 08/04/18 0623  WBC 8.5 9.7 10.2 8.0  HGB 9.3* 10.5* 11.2* 10.1*  HCT 28.2* 32.5* 34.8* 31.4*  MCV 88.4 89.8 89.7 90.2  PLT 136* 178 199 204   Cardiac Enzymes: No results for input(s): CKTOTAL, CKMB, CKMBINDEX, TROPONINI in the last 168 hours. BNP: Invalid input(s): POCBNP CBG: No results for input(s): GLUCAP in the last 168 hours. HbA1C: No results for input(s): HGBA1C in the last 72 hours. Urine analysis:    Component Value Date/Time   COLORURINE YELLOW 07/26/2018  0636   APPEARANCEUR CLEAR 07/26/2018 0636   LABSPEC 1.010 07/26/2018 0636   PHURINE 5.0 07/26/2018 0636   GLUCOSEU NEGATIVE 07/26/2018 0636   HGBUR MODERATE (A) 07/26/2018 0636   BILIRUBINUR NEGATIVE 07/26/2018 0636   KETONESUR NEGATIVE 07/26/2018 0636   PROTEINUR 100 (A) 07/26/2018 0636   NITRITE NEGATIVE 07/26/2018 0636   LEUKOCYTESUR NEGATIVE 07/26/2018 0636   Sepsis Labs: '@LABRCNTIP' (procalcitonin:4,lacticidven:4) )No results found for this or any previous visit (from the past 240 hour(s)).   Scheduled Meds: . allopurinol  200 mg Oral Daily  . amLODipine  10 mg Oral Daily  . carvedilol  25 mg Oral BID WC  . Chlorhexidine Gluconate Cloth  6 each Topical Q0600  . doxazosin  2 mg Oral Daily  . febuxostat  40 mg Oral Daily  . furosemide  80 mg Oral Daily  . hydrALAZINE  100 mg Oral TID  . Influenza vac split quadrivalent PF  0.5 mL Intramuscular Tomorrow-1000  . isosorbide mononitrate  60 mg Oral Daily  . labetalol  20 mg Intravenous Once  . sevelamer carbonate  800 mg Oral TID WC  . sodium chloride flush  3 mL Intravenous Q12H   Continuous Infusions: . sodium chloride    . sodium chloride    . sodium chloride      Procedures/Studies: Dg Chest 2 View  Result Date: 07/26/2018 CLINICAL DATA:  Shortness of breath, hemoptysis. EXAM: CHEST - 2 VIEW COMPARISON:  None. FINDINGS: Mild cardiomegaly is noted with central pulmonary vascular congestion. Probable mild bilateral pulmonary edema is noted. No pneumothorax or pleural effusion is noted. Bony  thorax is unremarkable. IMPRESSION: Mild cardiomegaly with central pulmonary vascular congestion and probable mild bilateral pulmonary edema. Electronically Signed   By: Marijo Conception, M.D.   On: 07/26/2018 10:11   US Renal  Result Date: 07/26/2018 CLINICAL DATA:  Renal failure. EXAM: RENAL / URINARY TRACT ULTRASOUND COMPLETE COMPARISON:  07/26/2018. FINDINGS: Right Kidney: Length: 10.9 cm. Increased echogenicity. 1.7 cm hyperechoic  shadowing density noted the midportion of the right kidney. This could be a calcified renal mass. Large renal stone could also present this fashion. No hydronephrosis visualized. Left Kidney: Length: 9.4 cm. Increased echogenicity. 1.9 cm complex cystic versus solid mass noted the midportion of the left kidney. No hydronephrosis visualized. Bladder: Appears normal for degree of bladder distention. IMPRESSION: 1. Increased echogenicity both kidneys consistent with chronic medical renal disease. 2. 1.7 cm hyperechoic shadowing density in the midportion of the right kidney. This could represent a calcified renal mass. Large renal stone could present in this fashion. 1.9 cm complex cystic versus solid mass in the midportion of the left kidney. A renal tumor in either kidney cannot be excluded. For further evaluation MRI of the kidneys suggested. Electronically Signed   By: Marcello Moores  Register   On: 07/26/2018 13:57   Nm Pulmonary Vent And Perf (v/q Scan)  Result Date: 07/26/2018 CLINICAL DATA:  Shortness of Breath EXAM: NUCLEAR MEDICINE VENTILATION - PERFUSION LUNG SCAN VIEWS: Anterior, posterior, left lateral, right lateral, RPO, LPO, RAO, LAO-ventilation and perfusion RADIOPHARMACEUTICALS:  33.0 mCi of Tc-64mDTPA aerosol inhalation and 4.4 mCi Tc942mAA IV COMPARISON:  Chest radiograph July 26, 2018 FINDINGS: Ventilation: Radiotracer uptake is homogeneous and symmetric bilaterally. No ventilation defects are evident. Perfusion: Radiotracer uptake is homogeneous and symmetric bilaterally. No perfusion defects are evident. IMPRESSION: No ventilation or perfusion defects. Very low probability of pulmonary embolus. Electronically Signed   By: WiLowella GripII M.D.   On: 07/26/2018 13:33   Ir Fluoro Guide Cv Line Right  Result Date: 07/30/2018 INDICATION: 89ear old male with progressive chronic kidney disease now in need of hemodialysis. He presents for placement of a tunneled hemodialysis catheter. EXAM:  TUNNELED CENTRAL VENOUS HEMODIALYSIS CATHETER PLACEMENT WITH ULTRASOUND AND FLUOROSCOPIC GUIDANCE MEDICATIONS: 2 g Ancef. The antibiotic was given in an appropriate time interval prior to skin puncture. ANESTHESIA/SEDATION: Moderate (conscious) sedation was employed during this procedure. A total of Versed 2 mg and Fentanyl 100 mcg was administered intravenously. Moderate Sedation Time: 20 minutes. The patient's level of consciousness and vital signs were monitored continuously by radiology nursing throughout the procedure under my direct supervision. FLUOROSCOPY TIME:  Fluoroscopy Time: 1 minutes 6 seconds (3 mGy). COMPLICATIONS: None immediate. PROCEDURE: Informed written consent was obtained from the patient after a discussion of the risks, benefits, and alternatives to treatment. Questions regarding the procedure were encouraged and answered. The right neck and chest were prepped with chlorhexidine in a sterile fashion, and a sterile drape was applied covering the operative field. Maximum barrier sterile technique with sterile gowns and gloves were used for the procedure. A timeout was performed prior to the initiation of the procedure. After creating a small venotomy incision, a micropuncture kit was utilized to access the right internal jugular vein under direct, real-time ultrasound guidance after the overlying soft tissues were anesthetized with 1% lidocaine with epinephrine. Ultrasound image documentation was performed. The microwire was kinked to measure appropriate catheter length. A stiff Glidewire was advanced to the level of the IVC and the micropuncture sheath was exchanged for a peel-away sheath. A palindrome tunneled  hemodialysis catheter measuring 23 cm from tip to cuff was tunneled in a retrograde fashion from the anterior chest wall to the venotomy incision. The catheter was then placed through the peel-away sheath with tips ultimately positioned within the superior aspect of the right atrium.  Final catheter positioning was confirmed and documented with a spot radiographic image. The catheter aspirates and flushes normally. The catheter was flushed with appropriate volume heparin dwells. The catheter exit site was secured with a 0-Prolene retention suture. The venotomy incision was closed with Dermabond. Dressings were applied. The patient tolerated the procedure well without immediate post procedural complication. IMPRESSION: Successful placement of 23 cm tip to cuff tunneled hemodialysis catheter via the right internal jugular vein with tips terminating within the superior aspect of the right atrium. The catheter is ready for immediate use. Electronically Signed   By: Jacqulynn Cadet M.D.   On: 07/30/2018 13:59   Ir US Guide Vasc Access Right  Result Date: 07/30/2018 INDICATION: 27 year old male with progressive chronic kidney disease now in need of hemodialysis. He presents for placement of a tunneled hemodialysis catheter. EXAM: TUNNELED CENTRAL VENOUS HEMODIALYSIS CATHETER PLACEMENT WITH ULTRASOUND AND FLUOROSCOPIC GUIDANCE MEDICATIONS: 2 g Ancef. The antibiotic was given in an appropriate time interval prior to skin puncture. ANESTHESIA/SEDATION: Moderate (conscious) sedation was employed during this procedure. A total of Versed 2 mg and Fentanyl 100 mcg was administered intravenously. Moderate Sedation Time: 20 minutes. The patient's level of consciousness and vital signs were monitored continuously by radiology nursing throughout the procedure under my direct supervision. FLUOROSCOPY TIME:  Fluoroscopy Time: 1 minutes 6 seconds (3 mGy). COMPLICATIONS: None immediate. PROCEDURE: Informed written consent was obtained from the patient after a discussion of the risks, benefits, and alternatives to treatment. Questions regarding the procedure were encouraged and answered. The right neck and chest were prepped with chlorhexidine in a sterile fashion, and a sterile drape was applied covering the  operative field. Maximum barrier sterile technique with sterile gowns and gloves were used for the procedure. A timeout was performed prior to the initiation of the procedure. After creating a small venotomy incision, a micropuncture kit was utilized to access the right internal jugular vein under direct, real-time ultrasound guidance after the overlying soft tissues were anesthetized with 1% lidocaine with epinephrine. Ultrasound image documentation was performed. The microwire was kinked to measure appropriate catheter length. A stiff Glidewire was advanced to the level of the IVC and the micropuncture sheath was exchanged for a peel-away sheath. A palindrome tunneled hemodialysis catheter measuring 23 cm from tip to cuff was tunneled in a retrograde fashion from the anterior chest wall to the venotomy incision. The catheter was then placed through the peel-away sheath with tips ultimately positioned within the superior aspect of the right atrium. Final catheter positioning was confirmed and documented with a spot radiographic image. The catheter aspirates and flushes normally. The catheter was flushed with appropriate volume heparin dwells. The catheter exit site was secured with a 0-Prolene retention suture. The venotomy incision was closed with Dermabond. Dressings were applied. The patient tolerated the procedure well without immediate post procedural complication. IMPRESSION: Successful placement of 23 cm tip to cuff tunneled hemodialysis catheter via the right internal jugular vein with tips terminating within the superior aspect of the right atrium. The catheter is ready for immediate use. Electronically Signed   By: Jacqulynn Cadet M.D.   On: 07/30/2018 13:59    Orson Eva, DO  [facility] Pager [phone]  If 7PM-7AM, please contact  night-coverage [email or web address] Password [email or web address] 08/04/2018, 3:40 PM   LOS: 9 days

## 2018-08-04 NOTE — Discharge Summary (Signed)
Physician Discharge Summary  Edward Thomas KGS:[id number] DOB: 1991-09-23 DOA: 07/26/2018  PCP: Patient, No Pcp Per  Admit date: 07/26/2018 Discharge date: 08/06/2018  Admitted From: Home Disposition:  Home   Recommendations for Outpatient Follow-up:  1. Follow up with PCP in 1-2 weeks 2. Please obtain BMP/CBC in one week    Discharge Condition: Stable CODE STATUS: FULL Diet recommendation: Heart Healthy   Brief/Interim Summary: 27 y.o.malewith past medical history relevant for long-standing history of hypertension diagnosed as a teenager(2008)patient has not been compliant with antihypertensives for more than8years nowpresents with progressive dyspnea on exertion, orthopnea, paroxysmal nocturnal dyspnea over the last several daysprior to admission.In ED,blood pressure was 251/154,d-dimer was elevated but VQ scan was low forprobabilityfor PE.Clinical exam in the ED and chest x-ray as well as BMP were consistent withfluid overload.The patient noted to have serum creatinine of 9.55. Renal ultrasound was negative for hydronephrosis. Nephrology was consulted to assist with management. Interventional radiology was consulted, and a tunneled dialysis catheter was placed on 07/30/2018. The patient underwent his first ever hemodialysis on the evening of 07/30/2018. Echocardiogram showed EF 20% with diffuse HK and aching in the basal inferior myocardium. Cardiology was consulted to assist. They recommended to optimize blood pressure control and to repeat echocardiogram to 3 months. An outpatient appointment will be set up after discharge. A dialysis chair was obtained in [place].  His first HD will be 08/08/18 in the out pt setting.  Last HD in hospital was 08/05/18.  He was cleared to be discharged by nephrology.  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  Acute systolic CHF -BNP > 5859 at time of admission -07/26/2018 echo EF 20%, diffuse HK, AK basal inferior myocardium, mild AI, MR. Mild decreased RV function. PASP 45 -Continue fluid removal on dialysis -Holding off ACEidue to renal failure -repeat Echo in 3 months -continue coreg  Hypertensive cardiomyopathy -New LV dysfunction found on 2D echo -Troponins are flat -Personally reviewed EKG--no concerning ischemic changes, shows LVH  Anemia of CKD -Erythropoietin per nephrology -Iron saturation 20%, ferritin 121  Hyperuricemia -07/28/2018 uric acid 11.8 -Continue allopurinol  Tobacco abuse -Declined NicoDerm patch -I have discussed tobacco cessation with the patient. I have counseled the patient regarding the negative impacts of continued tobacco use including but not limited to lung cancer, COPD, and cardiovascular disease. I have discussed alternatives to tobacco and modalities that may help facilitate tobacco cessation including but not limited to biofeedback, hypnosis, and medications. Total time spent with tobacco counseling was 4 minutes.  Hypokalemia -repleted    Discharge Instructions   Allergies as of 08/06/2018   No Known Allergies     Medication List    TAKE these medications   amLODipine 10 MG tablet Commonly known as:   NORVASC Take 1 tablet (10 mg total) by mouth daily. Start taking on:  08/07/2018   carvedilol 25 MG tablet Commonly known as:  COREG Take 1 tablet (25 mg total) by mouth  2 (two) times daily with a meal. Start taking on:  08/07/2018   doxazosin 2 MG tablet Commonly known as:  CARDURA Take 1 tablet (2 mg total) by mouth daily. Start taking on:  08/07/2018   furosemide 80 MG tablet Commonly known as:  LASIX Take 1 tablet (80 mg total) by mouth daily. Start taking on:  08/07/2018   hydrALAZINE 100 MG tablet Commonly known as:  APRESOLINE Take 1 tablet (100 mg total) by mouth 3 (three) times daily.   isosorbide mononitrate 60 MG 24 hr tablet Commonly known as:  IMDUR Take 1 tablet (60 mg total) by mouth daily. Start taking on:  08/07/2018   sevelamer carbonate 800 MG tablet Commonly known as:  RENVELA Take 1 tablet (800 mg total) by mouth 3 (three) times daily with meals. Start taking on:  08/07/2018      Follow-up Information    Imogene Burn, PA-C Follow up on 09/02/2018.   Specialty:  Cardiology Why:  Cardiology Hospital Follow-Up on 09/02/2018 at 2:00PM.  Contact information: [place] [address] [phone]          No Known Allergies  Consultations:  none   Procedures/Studies: Dg Chest 2 View  Result Date: 07/26/2018 CLINICAL DATA:  Shortness of breath, hemoptysis. EXAM: CHEST - 2 VIEW COMPARISON:  None. FINDINGS: Mild cardiomegaly is noted with central pulmonary vascular congestion. Probable mild bilateral pulmonary edema is noted. No pneumothorax or pleural effusion is noted. Bony thorax is unremarkable. IMPRESSION: Mild cardiomegaly with central pulmonary vascular congestion and probable mild bilateral pulmonary edema. Electronically Signed   By: Marijo Conception, M.D.   On: 07/26/2018 10:11   US Renal  Result Date: 07/26/2018 CLINICAL DATA:  Renal failure. EXAM: RENAL / URINARY TRACT ULTRASOUND COMPLETE COMPARISON:  07/26/2018. FINDINGS:  Right Kidney: Length: 10.9 cm. Increased echogenicity. 1.7 cm hyperechoic shadowing density noted the midportion of the right kidney. This could be a calcified renal mass. Large renal stone could also present this fashion. No hydronephrosis visualized. Left Kidney: Length: 9.4 cm. Increased echogenicity. 1.9 cm complex cystic versus solid mass noted the midportion of the left kidney. No hydronephrosis visualized. Bladder: Appears normal for degree of bladder distention. IMPRESSION: 1. Increased echogenicity both kidneys consistent with chronic medical renal disease. 2. 1.7 cm hyperechoic shadowing density in the midportion of the right kidney. This could represent a calcified renal mass. Large renal stone could present in this fashion. 1.9 cm complex cystic versus solid mass in the midportion of the left kidney. A renal tumor in either kidney cannot be excluded. For further evaluation MRI of the kidneys suggested. Electronically Signed   By: Marcello Moores  Register   On: 07/26/2018 13:57   Nm Pulmonary Vent And Perf (v/q Scan)  Result Date: 07/26/2018 CLINICAL DATA:  Shortness of Breath EXAM: NUCLEAR MEDICINE VENTILATION - PERFUSION LUNG SCAN VIEWS: Anterior, posterior, left lateral, right lateral, RPO, LPO, RAO, LAO-ventilation and perfusion RADIOPHARMACEUTICALS:  33.0 mCi of Tc-47mDTPA aerosol inhalation and 4.4 mCi Tc947mAA IV COMPARISON:  Chest radiograph July 26, 2018 FINDINGS: Ventilation: Radiotracer uptake is homogeneous and symmetric bilaterally. No ventilation defects are evident. Perfusion: Radiotracer uptake is homogeneous and symmetric bilaterally. No perfusion defects are evident. IMPRESSION: No ventilation or perfusion defects. Very low probability of pulmonary embolus. Electronically Signed   By: WiLowella GripII M.D.   On: 07/26/2018 13:33   Ir Fluoro Guide Cv Line Right  Result Date: 07/30/2018 INDICATION: [age]ear old male with progressive chronic kidney disease now  in need of  hemodialysis. He presents for placement of a tunneled hemodialysis catheter. EXAM: TUNNELED CENTRAL VENOUS HEMODIALYSIS CATHETER PLACEMENT WITH ULTRASOUND AND FLUOROSCOPIC GUIDANCE MEDICATIONS: 2 g Ancef. The antibiotic was given in an appropriate time interval prior to skin puncture. ANESTHESIA/SEDATION: Moderate (conscious) sedation was employed during this procedure. A total of Versed 2 mg and Fentanyl 100 mcg was administered intravenously. Moderate Sedation Time: 20 minutes. The patient's level of consciousness and vital signs were monitored continuously by radiology nursing throughout the procedure under my direct supervision. FLUOROSCOPY TIME:  Fluoroscopy Time: 1 minutes 6 seconds (3 mGy). COMPLICATIONS: None immediate. PROCEDURE: Informed written consent was obtained from the patient after a discussion of the risks, benefits, and alternatives to treatment. Questions regarding the procedure were encouraged and answered. The right neck and chest were prepped with chlorhexidine in a sterile fashion, and a sterile drape was applied covering the operative field. Maximum barrier sterile technique with sterile gowns and gloves were used for the procedure. A timeout was performed prior to the initiation of the procedure. After creating a small venotomy incision, a micropuncture kit was utilized to access the right internal jugular vein under direct, real-time ultrasound guidance after the overlying soft tissues were anesthetized with 1% lidocaine with epinephrine. Ultrasound image documentation was performed. The microwire was kinked to measure appropriate catheter length. A stiff Glidewire was advanced to the level of the IVC and the micropuncture sheath was exchanged for a peel-away sheath. A palindrome tunneled hemodialysis catheter measuring 23 cm from tip to cuff was tunneled in a retrograde fashion from the anterior chest wall to the venotomy incision. The catheter was then placed through the peel-away sheath  with tips ultimately positioned within the superior aspect of the right atrium. Final catheter positioning was confirmed and documented with a spot radiographic image. The catheter aspirates and flushes normally. The catheter was flushed with appropriate volume heparin dwells. The catheter exit site was secured with a 0-Prolene retention suture. The venotomy incision was closed with Dermabond. Dressings were applied. The patient tolerated the procedure well without immediate post procedural complication. IMPRESSION: Successful placement of 23 cm tip to cuff tunneled hemodialysis catheter via the right internal jugular vein with tips terminating within the superior aspect of the right atrium. The catheter is ready for immediate use. Electronically Signed   By: Jacqulynn Cadet M.D.   On: 07/30/2018 13:59   Ir US Guide Vasc Access Right  Result Date: 07/30/2018 INDICATION: 27 year old male with progressive chronic kidney disease now in need of hemodialysis. He presents for placement of a tunneled hemodialysis catheter. EXAM: TUNNELED CENTRAL VENOUS HEMODIALYSIS CATHETER PLACEMENT WITH ULTRASOUND AND FLUOROSCOPIC GUIDANCE MEDICATIONS: 2 g Ancef. The antibiotic was given in an appropriate time interval prior to skin puncture. ANESTHESIA/SEDATION: Moderate (conscious) sedation was employed during this procedure. A total of Versed 2 mg and Fentanyl 100 mcg was administered intravenously. Moderate Sedation Time: 20 minutes. The patient's level of consciousness and vital signs were monitored continuously by radiology nursing throughout the procedure under my direct supervision. FLUOROSCOPY TIME:  Fluoroscopy Time: 1 minutes 6 seconds (3 mGy). COMPLICATIONS: None immediate. PROCEDURE: Informed written consent was obtained from the patient after a discussion of the risks, benefits, and alternatives to treatment. Questions regarding the procedure were encouraged and answered. The right neck and chest were prepped with  chlorhexidine in a sterile fashion, and a sterile drape was applied covering the operative field. Maximum barrier sterile technique with sterile gowns and gloves were used for the procedure.  A timeout was performed prior to the initiation of the procedure. After creating a small venotomy incision, a micropuncture kit was utilized to access the right internal jugular vein under direct, real-time ultrasound guidance after the overlying soft tissues were anesthetized with 1% lidocaine with epinephrine. Ultrasound image documentation was performed. The microwire was kinked to measure appropriate catheter length. A stiff Glidewire was advanced to the level of the IVC and the micropuncture sheath was exchanged for a peel-away sheath. A palindrome tunneled hemodialysis catheter measuring 23 cm from tip to cuff was tunneled in a retrograde fashion from the anterior chest wall to the venotomy incision. The catheter was then placed through the peel-away sheath with tips ultimately positioned within the superior aspect of the right atrium. Final catheter positioning was confirmed and documented with a spot radiographic image. The catheter aspirates and flushes normally. The catheter was flushed with appropriate volume heparin dwells. The catheter exit site was secured with a 0-Prolene retention suture. The venotomy incision was closed with Dermabond. Dressings were applied. The patient tolerated the procedure well without immediate post procedural complication. IMPRESSION: Successful placement of 23 cm tip to cuff tunneled hemodialysis catheter via the right internal jugular vein with tips terminating within the superior aspect of the right atrium. The catheter is ready for immediate use. Electronically Signed   By: Jacqulynn Cadet M.D.   On: 07/30/2018 13:59        Discharge Exam: Vitals:   08/06/18 0535 08/06/18 1415  BP: (!) 140/98 123/86  Pulse: (!) 102 100  Resp:  19  Temp: 98.3 F (36.8 C) 98.7 F (37.1  C)  SpO2: 100% 99%   Vitals:   08/05/18 2253 08/05/18 2347 08/06/18 0535 08/06/18 1415  BP: (!) 141/105 (!) 134/98 (!) 140/98 123/86  Pulse: 100 (!) 104 (!) 102 100  Resp:    19  Temp:   98.3 F (36.8 C) 98.7 F (37.1 C)  TempSrc:   Oral   SpO2:   100% 99%  Weight:   74 kg   Height:        General: Pt is alert, awake, not in acute distress Cardiovascular: RRR, S1/S2 +, no rubs, no gallops Respiratory: CTA bilaterally, no wheezing, no rhonchi Abdominal: Soft, NT, ND, bowel sounds + Extremities: no edema, no cyanosis   The results of significant diagnostics from this hospitalization (including imaging, microbiology, ancillary and laboratory) are listed below for reference.    Significant Diagnostic Studies: Dg Chest 2 View  Result Date: 07/26/2018 CLINICAL DATA:  Shortness of breath, hemoptysis. EXAM: CHEST - 2 VIEW COMPARISON:  None. FINDINGS: Mild cardiomegaly is noted with central pulmonary vascular congestion. Probable mild bilateral pulmonary edema is noted. No pneumothorax or pleural effusion is noted. Bony thorax is unremarkable. IMPRESSION: Mild cardiomegaly with central pulmonary vascular congestion and probable mild bilateral pulmonary edema. Electronically Signed   By: Marijo Conception, M.D.   On: 07/26/2018 10:11   US Renal  Result Date: 07/26/2018 CLINICAL DATA:  Renal failure. EXAM: RENAL / URINARY TRACT ULTRASOUND COMPLETE COMPARISON:  07/26/2018. FINDINGS: Right Kidney: Length: 10.9 cm. Increased echogenicity. 1.7 cm hyperechoic shadowing density noted the midportion of the right kidney. This could be a calcified renal mass. Large renal stone could also present this fashion. No hydronephrosis visualized. Left Kidney: Length: 9.4 cm. Increased echogenicity. 1.9 cm complex cystic versus solid mass noted the midportion of the left kidney. No hydronephrosis visualized. Bladder: Appears normal for degree of bladder distention. IMPRESSION: 1. Increased echogenicity both  kidneys consistent with chronic medical renal disease. 2. 1.7 cm hyperechoic shadowing density in the midportion of the right kidney. This could represent a calcified renal mass. Large renal stone could present in this fashion. 1.9 cm complex cystic versus solid mass in the midportion of the left kidney. A renal tumor in either kidney cannot be excluded. For further evaluation MRI of the kidneys suggested. Electronically Signed   By: Marcello Moores  Register   On: 07/26/2018 13:57   Nm Pulmonary Vent And Perf (v/q Scan)  Result Date: 07/26/2018 CLINICAL DATA:  Shortness of Breath EXAM: NUCLEAR MEDICINE VENTILATION - PERFUSION LUNG SCAN VIEWS: Anterior, posterior, left lateral, right lateral, RPO, LPO, RAO, LAO-ventilation and perfusion RADIOPHARMACEUTICALS:  33.0 mCi of Tc-50mDTPA aerosol inhalation and 4.4 mCi Tc9109mAA IV COMPARISON:  Chest radiograph July 26, 2018 FINDINGS: Ventilation: Radiotracer uptake is homogeneous and symmetric bilaterally. No ventilation defects are evident. Perfusion: Radiotracer uptake is homogeneous and symmetric bilaterally. No perfusion defects are evident. IMPRESSION: No ventilation or perfusion defects. Very low probability of pulmonary embolus. Electronically Signed   By: WiLowella GripII M.D.   On: 07/26/2018 13:33   Ir Fluoro Guide Cv Line Right  Result Date: 07/30/2018 INDICATION: [age]ear old male with progressive chronic kidney disease now in need of hemodialysis. He presents for placement of a tunneled hemodialysis catheter. EXAM: TUNNELED CENTRAL VENOUS HEMODIALYSIS CATHETER PLACEMENT WITH ULTRASOUND AND FLUOROSCOPIC GUIDANCE MEDICATIONS: 2 g Ancef. The antibiotic was given in an appropriate time interval prior to skin puncture. ANESTHESIA/SEDATION: Moderate (conscious) sedation was employed during this procedure. A total of Versed 2 mg and Fentanyl 100 mcg was administered intravenously. Moderate Sedation Time: 20 minutes. The patient's level of consciousness and  vital signs were monitored continuously by radiology nursing throughout the procedure under my direct supervision. FLUOROSCOPY TIME:  Fluoroscopy Time: 1 minutes 6 seconds (3 mGy). COMPLICATIONS: None immediate. PROCEDURE: Informed written consent was obtained from the patient after a discussion of the risks, benefits, and alternatives to treatment. Questions regarding the procedure were encouraged and answered. The right neck and chest were prepped with chlorhexidine in a sterile fashion, and a sterile drape was applied covering the operative field. Maximum barrier sterile technique with sterile gowns and gloves were used for the procedure. A timeout was performed prior to the initiation of the procedure. After creating a small venotomy incision, a micropuncture kit was utilized to access the right internal jugular vein under direct, real-time ultrasound guidance after the overlying soft tissues were anesthetized with 1% lidocaine with epinephrine. Ultrasound image documentation was performed. The microwire was kinked to measure appropriate catheter length. A stiff Glidewire was advanced to the level of the IVC and the micropuncture sheath was exchanged for a peel-away sheath. A palindrome tunneled hemodialysis catheter measuring 23 cm from tip to cuff was tunneled in a retrograde fashion from the anterior chest wall to the venotomy incision. The catheter was then placed through the peel-away sheath with tips ultimately positioned within the superior aspect of the right atrium. Final catheter positioning was confirmed and documented with a spot radiographic image. The catheter aspirates and flushes normally. The catheter was flushed with appropriate volume heparin dwells. The catheter exit site was secured with a 0-Prolene retention suture. The venotomy incision was closed with Dermabond. Dressings were applied. The patient tolerated the procedure well without immediate post procedural complication. IMPRESSION:  Successful placement of 23 cm tip to cuff tunneled hemodialysis catheter via the right internal jugular vein with tips terminating within the superior aspect of  the right atrium. The catheter is ready for immediate use. Electronically Signed   By: Jacqulynn Cadet M.D.   On: 07/30/2018 13:59   Ir US Guide Vasc Access Right  Result Date: 07/30/2018 INDICATION: 27 year old male with progressive chronic kidney disease now in need of hemodialysis. He presents for placement of a tunneled hemodialysis catheter. EXAM: TUNNELED CENTRAL VENOUS HEMODIALYSIS CATHETER PLACEMENT WITH ULTRASOUND AND FLUOROSCOPIC GUIDANCE MEDICATIONS: 2 g Ancef. The antibiotic was given in an appropriate time interval prior to skin puncture. ANESTHESIA/SEDATION: Moderate (conscious) sedation was employed during this procedure. A total of Versed 2 mg and Fentanyl 100 mcg was administered intravenously. Moderate Sedation Time: 20 minutes. The patient's level of consciousness and vital signs were monitored continuously by radiology nursing throughout the procedure under my direct supervision. FLUOROSCOPY TIME:  Fluoroscopy Time: 1 minutes 6 seconds (3 mGy). COMPLICATIONS: None immediate. PROCEDURE: Informed written consent was obtained from the patient after a discussion of the risks, benefits, and alternatives to treatment. Questions regarding the procedure were encouraged and answered. The right neck and chest were prepped with chlorhexidine in a sterile fashion, and a sterile drape was applied covering the operative field. Maximum barrier sterile technique with sterile gowns and gloves were used for the procedure. A timeout was performed prior to the initiation of the procedure. After creating a small venotomy incision, a micropuncture kit was utilized to access the right internal jugular vein under direct, real-time ultrasound guidance after the overlying soft tissues were anesthetized with 1% lidocaine with epinephrine. Ultrasound image  documentation was performed. The microwire was kinked to measure appropriate catheter length. A stiff Glidewire was advanced to the level of the IVC and the micropuncture sheath was exchanged for a peel-away sheath. A palindrome tunneled hemodialysis catheter measuring 23 cm from tip to cuff was tunneled in a retrograde fashion from the anterior chest wall to the venotomy incision. The catheter was then placed through the peel-away sheath with tips ultimately positioned within the superior aspect of the right atrium. Final catheter positioning was confirmed and documented with a spot radiographic image. The catheter aspirates and flushes normally. The catheter was flushed with appropriate volume heparin dwells. The catheter exit site was secured with a 0-Prolene retention suture. The venotomy incision was closed with Dermabond. Dressings were applied. The patient tolerated the procedure well without immediate post procedural complication. IMPRESSION: Successful placement of 23 cm tip to cuff tunneled hemodialysis catheter via the right internal jugular vein with tips terminating within the superior aspect of the right atrium. The catheter is ready for immediate use. Electronically Signed   By: Jacqulynn Cadet M.D.   On: 07/30/2018 13:59     Microbiology: No results found for this or any previous visit (from the past 240 hour(s)).   Labs: Basic Metabolic Panel: Recent Labs  Lab 07/31/18 0505 08/02/18 0507 08/04/18 0623 08/05/18 0500  NA 136 136 136 137  K 3.7 3.4* 3.5 3.5  CL 96* 94* 98 96*  CO2 _0 GLUCOSE 99 93 92 107*  BUN 51* 46* 39* 45*  CREATININE 8.15* 8.26* 7.41* 8.01*  CALCIUM 9.3 9.1 8.6* 8.6*  PHOS 4.6 7.4* 6.9* 6.4*   Liver Function Tests: Recent Labs  Lab 07/31/18 0505 08/02/18 0507 08/04/18 0623 08/05/18 0500  ALBUMIN 3.5 3.6 3.2* 2.9*   No results for input(s): LIPASE, AMYLASE in the last 168 hours. No results for input(s): AMMONIA in the last 168  hours. CBC: Recent Labs  Lab 07/31/18 0505 08/02/18 0507  08/04/18 0623 08/05/18 0500  WBC 9.7 10.2 8.0 7.9  HGB 10.5* 11.2* 10.1* 9.4*  HCT 32.5* 34.8* 31.4* 29.4*  MCV 89.8 89.7 90.2 90.7  PLT 178 199 204 197   Cardiac Enzymes: No results for input(s): CKTOTAL, CKMB, CKMBINDEX, TROPONINI in the last 168 hours. BNP: Invalid input(s): POCBNP CBG: No results for input(s): GLUCAP in the last 168 hours.  Time coordinating discharge:  36 minutes  Signed:  Orson Eva, DO [facility] Pager: [phone] 08/06/2018, 5:28 PM

## 2018-08-05 LAB — RENAL FUNCTION PANEL
ALBUMIN: 2.9 g/dL — AB (ref 3.5–5.0)
ANION GAP: 10 (ref 5–15)
BUN: 45 mg/dL — ABNORMAL HIGH (ref 6–20)
CALCIUM: 8.6 mg/dL — AB (ref 8.9–10.3)
CO2: 31 mmol/L (ref 22–32)
Chloride: 96 mmol/L — ABNORMAL LOW (ref 98–111)
Creatinine, Ser: 8.01 mg/dL — ABNORMAL HIGH (ref 0.61–1.24)
GFR calc non Af Amer: 8 mL/min — ABNORMAL LOW (ref >60)
GFR, EST AFRICAN AMERICAN: 9 mL/min — AB (ref >60)
GLUCOSE: 107 mg/dL — AB (ref 70–99)
PHOSPHORUS: 6.4 mg/dL — AB (ref 2.5–4.6)
Potassium: 3.5 mmol/L (ref 3.5–5.1)
SODIUM: 137 mmol/L (ref 135–145)

## 2018-08-05 LAB — CBC
HCT: 29.4 % — ABNORMAL LOW (ref 39.0–52.0)
HEMOGLOBIN: 9.4 g/dL — AB (ref 13.0–17.0)
MCH: 29 pg (ref 26.0–34.0)
MCHC: 32 g/dL (ref 30.0–36.0)
MCV: 90.7 fL (ref 78.0–100.0)
Platelets: 197 10*3/uL (ref 150–400)
RBC: 3.24 MIL/uL — ABNORMAL LOW (ref 4.22–5.81)
RDW: 15.1 % (ref 11.5–15.5)
WBC: 7.9 10*3/uL (ref 4.0–10.5)

## 2018-08-05 MED ORDER — SODIUM CHLORIDE 0.9 % IV SOLN: 100.0000 mL | INTRAVENOUS | Status: DC | PRN

## 2018-08-05 NOTE — Procedures (Signed)
      HEMODIALYSIS TREATMENT NOTE (HD#4):  3.5 hour hemodialysis session completed via right IJ tunneled catheter. Exit site unremarkable. Goal met: 1 liter removed without interruption in ultrafiltration.  All blood was returned.  Rockwell Alexandria, RN

## 2018-08-05 NOTE — Progress Notes (Signed)
PROGRESS NOTE  Edward Thomas OIN:[id number] DOB: Jun 28, 1991 DOA: 07/26/2018 PCP: Patient, No Pcp Per  Brief History: 27 y.o.malewith past medical history relevant for long-standing history of hypertension diagnosed as a teenager(2008)patient has not been compliant with antihypertensives for more than8years nowpresents with progressive dyspnea on exertion, orthopnea, paroxysmal nocturnal dyspnea over the last several daysprior to admission.In ED,blood pressure was 251/154,d-dimer was elevated but VQ scan was low forprobabilityfor PE.Clinical exam in the ED and chest x-ray as well as BMP were consistent withfluid overload.The patient noted to have serum creatinine of 9.55. Renal ultrasound was negative for hydronephrosis. Nephrology was consulted to assist with management. Interventional radiology was consulted, and a tunneled dialysis catheter was placed on 07/30/2018. The patient underwent his first ever hemodialysis on the evening of 07/30/2018. Echocardiogram showed EF 20% with diffuse HK and aching in the basal inferior myocardium. Cardiology was consulted to assist. They recommended to optimize blood pressure control and to repeat echocardiogram to 3 months. An outpatient appointment will be set up after discharge.  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 Mild decreased RV function. PASP 45 -Continue fluid removal on dialysis -Holding off ACEidue to renal failure -repeat Echo in 3 months  Hypertensive cardiomyopathy -New LV dysfunction found on 2D echo -Troponins are flat -Personally reviewed EKG--no concerning ischemic changes, shows LVH  Anemia of CKD -Erythropoietin per nephrology -Iron saturation 20%, ferritin 121  Hyperuricemia -07/28/2018 uric acid 11.8 -Continue allopurinol  Tobacco abuse -Declined NicoDerm patch -I have discussed tobacco cessation with the patient. I have counseled the patient regarding the negative impacts of continued tobacco use including but not limited to lung cancer, COPD, and cardiovascular disease. I have discussed alternatives to tobacco and modalities that may help facilitate tobacco cessation including but not limited to biofeedback, hypnosis, and medications. Total time spent with tobacco counseling was 4 minutes.  Hypokalemia -repleted     Disposition Plan: Home once outpt HD chair obtained Family Communication: No family present  Consultants:nephrology Code Status: FULL   DVT Prophylaxis: SCDs   Procedures: As Listed in Progress Note Above  Antibiotics: None    Subjective: Patient denies fevers, chills, headache, chest pain, dyspnea, nausea, vomiting, diarrhea, abdominal pain, dysuria, hematuria, hematochezia, and melena.     Objective: Vitals:   08/05/18 1400 08/05/18 1430 08/05/18 1500 08/05/18 1530  BP: (!) 167/103 (!) 167/110 (!) 178/100 (!) 169/96  Pulse: 99 100 100 98  Resp:      Temp:      TempSrc:      SpO2:      Weight:      Height:        Intake/Output Summary (Last 24 hours) at 08/05/2018 1615 Last data filed at 08/05/2018  1530 Gross per 24 hour  Intake 720 ml  Output 2400 ml  Net -1680 ml   Weight change: 2 kg Exam:  General:  Pt is alert, follows commands appropriately, not in acute distress  HEENT: No icterus, No thrush, No neck mass, [place]/AT  Cardiovascular: RRR, S1/S2, no rubs, no gallops  Respiratory: CTA bilaterally, no wheezing, no crackles, no rhonchi  Abdomen: Soft/+BS, non tender, non distended, no guarding  Extremities: No edema, No lymphangitis, No petechiae, No rashes, no synovitis   Data Reviewed: I have personally reviewed following labs and imaging studies Basic Metabolic Panel: Recent Labs  Lab 07/30/18 0423 07/31/18 0505 08/02/18 0507 08/04/18 0623 08/05/18 0500  NA 134* 136 136 136 137  K 3.3* 3.7 3.4* 3.5 3.5  CL 95* 96* 94* 98 96*  CO2 '26 26 29 26 31  ' GLUCOSE 104* 99 93 92 107*  BUN 66* 51* 46* 39* 45*  CREATININE 9.75* 8.15* 8.26* 7.41* 8.01*  CALCIUM 9.0 9.3 9.1 8.6* 8.6*  PHOS 5.1* 4.6 7.4* 6.9* 6.4*   Liver Function Tests: Recent Labs  Lab 07/30/18 0423 07/31/18 0505 08/02/18 0507 08/04/18 0623 08/05/18 0500  ALBUMIN 3.2* 3.5 3.6 3.2* 2.9*   No results for input(s): LIPASE, AMYLASE in the last 168 hours. No results for input(s): AMMONIA in the last 168 hours. Coagulation Profile: No results for input(s): INR, PROTIME in the last 168 hours. CBC: Recent Labs  Lab 07/31/18 0505 08/02/18 0507 08/04/18 0623 08/05/18 0500  WBC 9.7 10.2 8.0 7.9  HGB 10.5* 11.2* 10.1* 9.4*  HCT 32.5* 34.8* 31.4* 29.4*  MCV 89.8 89.7 90.2 90.7  PLT 178 199 204 197   Cardiac Enzymes: No results for input(s): CKTOTAL, CKMB, CKMBINDEX, TROPONINI in the last 168 hours. BNP: Invalid input(s): POCBNP CBG: No results for input(s): GLUCAP in the last 168 hours. HbA1C: No results for input(s): HGBA1C in the last 72 hours. Urine analysis:    Component Value Date/Time   COLORURINE YELLOW 07/26/2018 0636   APPEARANCEUR CLEAR 07/26/2018 0636   LABSPEC 1.010  07/26/2018 0636   PHURINE 5.0 07/26/2018 0636   GLUCOSEU NEGATIVE 07/26/2018 0636   HGBUR MODERATE (A) 07/26/2018 0636   BILIRUBINUR NEGATIVE 07/26/2018 0636   KETONESUR NEGATIVE 07/26/2018 0636   PROTEINUR 100 (A) 07/26/2018 0636   NITRITE NEGATIVE 07/26/2018 0636   LEUKOCYTESUR NEGATIVE 07/26/2018 0636   Sepsis Labs: '@LABRCNTIP' (procalcitonin:4,lacticidven:4) )No results found for this or any previous visit (from the past 240 hour(s)).   Scheduled Meds: . allopurinol  200 mg Oral Daily  . amLODipine  10 mg Oral Daily  . carvedilol  25 mg Oral BID WC  . Chlorhexidine Gluconate Cloth  6 each Topical Q0600  . doxazosin  2 mg Oral Daily  . furosemide  80 mg Oral Daily  . hydrALAZINE  100 mg Oral TID  . Influenza vac split quadrivalent PF  0.5 mL Intramuscular Tomorrow-1000  . isosorbide mononitrate  60 mg Oral Daily  . labetalol  20 mg Intravenous Once  . sevelamer carbonate  800 mg Oral TID WC  . sodium chloride flush  3 mL Intravenous Q12H   Continuous Infusions: . sodium chloride    . sodium chloride    . sodium chloride    . sodium chloride    . sodium chloride      Procedures/Studies: Dg Chest 2 View  Result Date: 07/26/2018 CLINICAL DATA:  Shortness of breath, hemoptysis. EXAM: CHEST - 2 VIEW COMPARISON:  None. FINDINGS: Mild cardiomegaly is noted with central pulmonary vascular congestion. Probable mild bilateral pulmonary edema is noted. No pneumothorax or pleural effusion is noted. Bony thorax is unremarkable. IMPRESSION: Mild cardiomegaly  with central pulmonary vascular congestion and probable mild bilateral pulmonary edema. Electronically Signed   By: Marijo Conception, M.D.   On: 07/26/2018 10:11   US Renal  Result Date: 07/26/2018 CLINICAL DATA:  Renal failure. EXAM: RENAL / URINARY TRACT ULTRASOUND COMPLETE COMPARISON:  07/26/2018. FINDINGS: Right Kidney: Length: 10.9 cm. Increased echogenicity. 1.7 cm hyperechoic shadowing density noted the midportion of the right  kidney. This could be a calcified renal mass. Large renal stone could also present this fashion. No hydronephrosis visualized. Left Kidney: Length: 9.4 cm. Increased echogenicity. 1.9 cm complex cystic versus solid mass noted the midportion of the left kidney. No hydronephrosis visualized. Bladder: Appears normal for degree of bladder distention. IMPRESSION: 1. Increased echogenicity both kidneys consistent with chronic medical renal disease. 2. 1.7 cm hyperechoic shadowing density in the midportion of the right kidney. This could represent a calcified renal mass. Large renal stone could present in this fashion. 1.9 cm complex cystic versus solid mass in the midportion of the left kidney. A renal tumor in either kidney cannot be excluded. For further evaluation MRI of the kidneys suggested. Electronically Signed   By: Marcello Moores  Register   On: 07/26/2018 13:57   Nm Pulmonary Vent And Perf (v/q Scan)  Result Date: 07/26/2018 CLINICAL DATA:  Shortness of Breath EXAM: NUCLEAR MEDICINE VENTILATION - PERFUSION LUNG SCAN VIEWS: Anterior, posterior, left lateral, right lateral, RPO, LPO, RAO, LAO-ventilation and perfusion RADIOPHARMACEUTICALS:  33.0 mCi of Tc-28mDTPA aerosol inhalation and 4.4 mCi Tc951mAA IV COMPARISON:  Chest radiograph July 26, 2018 FINDINGS: Ventilation: Radiotracer uptake is homogeneous and symmetric bilaterally. No ventilation defects are evident. Perfusion: Radiotracer uptake is homogeneous and symmetric bilaterally. No perfusion defects are evident. IMPRESSION: No ventilation or perfusion defects. Very low probability of pulmonary embolus. Electronically Signed   By: WiLowella GripII M.D.   On: 07/26/2018 13:33   Ir Fluoro Guide Cv Line Right  Result Date: 07/30/2018 INDICATION: [age]ear old male with progressive chronic kidney disease now in need of hemodialysis. He presents for placement of a tunneled hemodialysis catheter. EXAM: TUNNELED CENTRAL VENOUS HEMODIALYSIS CATHETER  PLACEMENT WITH ULTRASOUND AND FLUOROSCOPIC GUIDANCE MEDICATIONS: 2 g Ancef. The antibiotic was given in an appropriate time interval prior to skin puncture. ANESTHESIA/SEDATION: Moderate (conscious) sedation was employed during this procedure. A total of Versed 2 mg and Fentanyl 100 mcg was administered intravenously. Moderate Sedation Time: 20 minutes. The patient's level of consciousness and vital signs were monitored continuously by radiology nursing throughout the procedure under my direct supervision. FLUOROSCOPY TIME:  Fluoroscopy Time: 1 minutes 6 seconds (3 mGy). COMPLICATIONS: None immediate. PROCEDURE: Informed written consent was obtained from the patient after a discussion of the risks, benefits, and alternatives to treatment. Questions regarding the procedure were encouraged and answered. The right neck and chest were prepped with chlorhexidine in a sterile fashion, and a sterile drape was applied covering the operative field. Maximum barrier sterile technique with sterile gowns and gloves were used for the procedure. A timeout was performed prior to the initiation of the procedure. After creating a small venotomy incision, a micropuncture kit was utilized to access the right internal jugular vein under direct, real-time ultrasound guidance after the overlying soft tissues were anesthetized with 1% lidocaine with epinephrine. Ultrasound image documentation was performed. The microwire was kinked to measure appropriate catheter length. A stiff Glidewire was advanced to the level of the IVC and the micropuncture sheath was exchanged for a peel-away sheath. A palindrome tunneled hemodialysis catheter measuring 23 cm from  tip to cuff was tunneled in a retrograde fashion from the anterior chest wall to the venotomy incision. The catheter was then placed through the peel-away sheath with tips ultimately positioned within the superior aspect of the right atrium. Final catheter positioning was confirmed and  documented with a spot radiographic image. The catheter aspirates and flushes normally. The catheter was flushed with appropriate volume heparin dwells. The catheter exit site was secured with a 0-Prolene retention suture. The venotomy incision was closed with Dermabond. Dressings were applied. The patient tolerated the procedure well without immediate post procedural complication. IMPRESSION: Successful placement of 23 cm tip to cuff tunneled hemodialysis catheter via the right internal jugular vein with tips terminating within the superior aspect of the right atrium. The catheter is ready for immediate use. Electronically Signed   By: Jacqulynn Cadet M.D.   On: 07/30/2018 13:59   Ir US Guide Vasc Access Right  Result Date: 07/30/2018 INDICATION: 27 year old male with progressive chronic kidney disease now in need of hemodialysis. He presents for placement of a tunneled hemodialysis catheter. EXAM: TUNNELED CENTRAL VENOUS HEMODIALYSIS CATHETER PLACEMENT WITH ULTRASOUND AND FLUOROSCOPIC GUIDANCE MEDICATIONS: 2 g Ancef. The antibiotic was given in an appropriate time interval prior to skin puncture. ANESTHESIA/SEDATION: Moderate (conscious) sedation was employed during this procedure. A total of Versed 2 mg and Fentanyl 100 mcg was administered intravenously. Moderate Sedation Time: 20 minutes. The patient's level of consciousness and vital signs were monitored continuously by radiology nursing throughout the procedure under my direct supervision. FLUOROSCOPY TIME:  Fluoroscopy Time: 1 minutes 6 seconds (3 mGy). COMPLICATIONS: None immediate. PROCEDURE: Informed written consent was obtained from the patient after a discussion of the risks, benefits, and alternatives to treatment. Questions regarding the procedure were encouraged and answered. The right neck and chest were prepped with chlorhexidine in a sterile fashion, and a sterile drape was applied covering the operative field. Maximum barrier sterile  technique with sterile gowns and gloves were used for the procedure. A timeout was performed prior to the initiation of the procedure. After creating a small venotomy incision, a micropuncture kit was utilized to access the right internal jugular vein under direct, real-time ultrasound guidance after the overlying soft tissues were anesthetized with 1% lidocaine with epinephrine. Ultrasound image documentation was performed. The microwire was kinked to measure appropriate catheter length. A stiff Glidewire was advanced to the level of the IVC and the micropuncture sheath was exchanged for a peel-away sheath. A palindrome tunneled hemodialysis catheter measuring 23 cm from tip to cuff was tunneled in a retrograde fashion from the anterior chest wall to the venotomy incision. The catheter was then placed through the peel-away sheath with tips ultimately positioned within the superior aspect of the right atrium. Final catheter positioning was confirmed and documented with a spot radiographic image. The catheter aspirates and flushes normally. The catheter was flushed with appropriate volume heparin dwells. The catheter exit site was secured with a 0-Prolene retention suture. The venotomy incision was closed with Dermabond. Dressings were applied. The patient tolerated the procedure well without immediate post procedural complication. IMPRESSION: Successful placement of 23 cm tip to cuff tunneled hemodialysis catheter via the right internal jugular vein with tips terminating within the superior aspect of the right atrium. The catheter is ready for immediate use. Electronically Signed   By: Jacqulynn Cadet M.D.   On: 07/30/2018 13:59    Orson Eva, DO  [facility] Pager [phone]  If 7PM-7AM, please contact night-coverage [email or web address] Password [email or web address] 08/05/2018, 4:15  PM   LOS: 10 days

## 2018-08-05 NOTE — Progress Notes (Signed)
Subjective: Interval History: His appetite remains good and denies any difficulty breathing.  He does not have any new complaints.  Objective: Vital signs in last 24 hours: Temp:  [97.4 F (36.3 C)-99.1 F (37.3 C)] 99.1 F (37.3 C) (09/30 0529) Pulse Rate:  [97-108] 108 (09/30 0529) Resp:  [18] 18 (09/30 0529) BP: (122-137)/(83-99) 122/83 (09/30 0529) SpO2:  [100 %] 100 % (09/29 2235) Weight:  [73.9 kg] 73.9 kg (09/30 0500) Weight change: 2 kg  Intake/Output from previous day: 09/29 0701 - 09/30 0700 In: 480 [P.O.:480] Out: -  Intake/Output this shift: No intake/output data recorded.  Generally patient is alert and in no apparent distress Chest: Is clear to auscultation. Heart exam revealed regular rate and rhythm no murmur Extremities no edema  Lab Results: Recent Labs    08/04/18 0623 08/05/18 0500  WBC 8.0 7.9  HGB 10.1* 9.4*  HCT 31.4* 29.4*  PLT 204 197   BMET:  Recent Labs    08/04/18 0623 08/05/18 0500  NA 136 137  K 3.5 3.5  CL 98 96*  CO2 26 31  GLUCOSE 92 107*  BUN 39* 45*  CREATININE 7.41* 8.01*  CALCIUM 8.6* 8.6*   No results for input(s): PTH in the last 72 hours. Iron Studies:  No results for input(s): IRON, TIBC, TRANSFERRIN, FERRITIN in the last 72 hours.  Studies/Results: No results found.  I have reviewed the patient's current medications.  Assessment/Plan: 1] malignant hypertension: His blood pressure is well controlled. 2] renal failure: He is status post dialysis on Friday.  Patient presently is asymptomatic.  His potassium is normal. 3] anemia: His hemoglobin has declined and is slightly lower than our target goal. 4] bone mineral disorder: His calcium  remains in range but his phosphorus is high.  Patient is started on binder. 5] left complex renal cyst which is incidentalfinding 6] severe LVH possibly hypertensive.  Patient remains nonoliguric.  Presently he does not have any sign of fluid overload. 7] hyperuricemia: On  allopurinol.  Denies any history of gout Plan: 1]  we will dialyze patient today for 31/2  hours 2] we will check CBC and renal panel in the morning 3] patient can be discharged once an outpatient dialysis is arranged. 4] his outpatient dialysis could be either on Wednesday or Thursday.   .122/83     LOS: 10 days   Edward Thomas S 08/05/2018,8:02 AM

## 2018-08-05 NOTE — Progress Notes (Signed)
Patient blood pressure was elevated 141/105 prior to receiving night time  dose of hydralazine. Rechecked patients blood pressure after receiving  medication current blood pressure 134/98. Patient asymptomatic. Will continue to monitor throughout shift.

## 2018-08-06 DIAGNOSIS — I10 Essential (primary) hypertension: Secondary | ICD-10-CM

## 2018-08-06 MED ORDER — FUROSEMIDE 80 MG PO TABS: 80.0000 mg | ORAL_TABLET | Freq: Every day | ORAL | 1 refills | Status: DC

## 2018-08-06 MED ORDER — AMLODIPINE BESYLATE 10 MG PO TABS: 10.0000 mg | ORAL_TABLET | Freq: Every day | ORAL | 1 refills | Status: DC

## 2018-08-06 MED ORDER — SEVELAMER CARBONATE 800 MG PO TABS: 800.0000 mg | ORAL_TABLET | Freq: Three times a day (TID) | ORAL | 1 refills | Status: AC

## 2018-08-06 MED ORDER — CARVEDILOL 25 MG PO TABS: 25.0000 mg | ORAL_TABLET | Freq: Two times a day (BID) | ORAL | 1 refills | Status: DC

## 2018-08-06 MED ORDER — ISOSORBIDE MONONITRATE ER 60 MG PO TB24: 60.0000 mg | ORAL_TABLET | Freq: Every day | ORAL | 1 refills | Status: DC

## 2018-08-06 MED ORDER — DOXAZOSIN MESYLATE 2 MG PO TABS: 2.0000 mg | ORAL_TABLET | Freq: Every day | ORAL | 1 refills | Status: AC

## 2018-08-06 MED ORDER — HYDRALAZINE HCL 100 MG PO TABS: 100.0000 mg | ORAL_TABLET | Freq: Three times a day (TID) | ORAL | 1 refills | Status: DC

## 2018-08-06 NOTE — Clinical Social Work Note (Addendum)
LCSW received the following email from [facility]:  Chair time at [facility] facility is TTS 11:15am. Pt. can start Thursday. Have pt. be there by 10:15am for paperwork.   LCSW spoke with Lavell Anchors, cousin and provided schedule. She stated that she would have patient at the facility on Thursday morning. LCSW advised that patient would likely discharge this evening or in the morning.    Anitta Tenny, Clydene Pugh, LCSW

## 2018-08-06 NOTE — Care Management Note (Signed)
Case Management Note  Patient Details  Name: Edward Thomas MRN: [id number] Date of Birth: [birth year]  If discussed at [facility] Length of Stay Meetings, dates discussed:  08/06/2018  Additional Comments:  Rad Gramling, Chauncey Reading, RN 08/06/2018, 12:08 PM

## 2018-08-06 NOTE — Progress Notes (Signed)
Patient discharging home.  IV removed - WNL.  Reviewed AVS and medications.  Educated on renal diet and foods to stay away from.  Educated on daily weighing.  Follow up in place with cardio and aware of dialysis apt on Thursday.  Verbalizes understanding.  No questions at this time. Waiting on arrival of ride. Patient in NAD

## 2018-08-06 NOTE — Discharge Instructions (Signed)
Dialysis Diet Dialysis is a treatment that cleans your blood. It is used when the kidneys are damaged. When you need dialysis, you should watch your diet. This is because some nutrients can build up in your blood between treatments and make you sick. These nutrients are:  Potassium.  Phosphorus.  Sodium.  Your doctor or dietitian will:  Tell you how much of these you can have.  Tell you if you need to look out for other nutrients too.  Help you plan meals.  Tell you how much to drink each day.  What do I need to know about this diet?  Limit potassium. Potassium is in milk, fruits, and vegetables.  Limit phosphorus. Phosphorus is in milk, cheese, beans, nuts, and carbonated beverages.  Limit salt (sodium). Foods that have a lot sodium include processed and cured meats, ready-made frozen meals, canned vegetables, and salty snack foods.  Do not use salt substitutes.  Try not to eat whole-grain foods and foods that have a lot of fiber.  Follow your doctor's instructions about how much to drink. You may be told to: ? Write down what you drink. ? Write down foods you eat that are made mostly from water, such as gelatin and soups. ? Drink from small cups.  Ask your doctor if you should take a medicine that binds phosphorus.  Take vitamin and mineral supplements only as told by your doctor.  Eat meat, poultry, fish, and eggs. Limit nuts and beans.  Before you cook potatoes, cut them into small pieces. Then boil them in unsalted water.  Drain all fluid from cooked vegetables and canned fruits before you eat them. What foods can I eat? Grains White bread. White rice. Cooked cereal. Unsalted popcorn. Tortillas. Pasta. Vegetables Fresh or frozen broccoli, carrots, and green beans. Cabbage. Cauliflower. Celery. Cucumbers. Eggplant. Radishes. Zucchini. Fruits Apples. Fresh or frozen berries. Fresh or canned pears, peaches, and pineapple. Grapes. Plums. Meats and Other Protein  Sources Fresh or frozen beef, pork, chicken, and fish. Eggs. Low-sodium canned tuna or salmon. Dairy Cream cheese. Heavy cream. Ricotta cheese. Beverages Apple cider. Cranberry juice. Grape juice. Lemonade. Black coffee. Condiments Herbs. Spices. Jam and jelly. Honey. Sweets and Desserts Sherbet. Cakes. Cookies. Fats and Oils Olive oil, canola oil, and safflower oil. Other Non-dairy creamer. Non-dairy whipped topping. Homemade broth without salt. The items listed above may not be a complete list of recommended foods or beverages. Contact your dietitian for more options. What foods are not recommended? Grains Whole-grain bread. Whole-grain pasta. High-fiber cereal. Vegetables Potatoes. Beets. Tomatoes. Winter squash and pumpkin. Asparagus. Spinach. Parsnips. Fruits Star fruit. Bananas. Oranges. Kiwi. Nectarines. Prunes. Melon. Dried fruit. Avocado. Meats and Other Protein Sources Canned, smoked, and cured meats. Soil scientist. Sardines. Nuts and seeds. Peanut butter. Beans and legumes. Dairy Milk. Buttermilk. Yogurt. Cheese and cottage cheese. Processed cheese spreads. Beverages Orange juice. Prune juice. Carbonated soft drinks. Condiments Salt. Salt substitutes. Soy sauce. Sweets and Desserts Ice cream. Chocolate. Candied nuts. Fats and Oils Butter. Margarine. Other Ready-made frozen meals. Canned soups. The items listed above may not be a complete list of foods and beverages to avoid. Contact your dietitian for more information. This information is not intended to replace advice given to you by your health care provider. Make sure you discuss any questions you have with your health care provider. Document Released: 04/23/2012 Document Revised: 03/30/2016 Document Reviewed: 05/26/2014 Elsevier Interactive Patient Education  [employer].

## 2018-08-06 NOTE — Clinical Social Work Note (Signed)
[facility] intake email and stated that the [facility] facility requested updated dialysis treatment notes and updated Hep B clinicals. Requested information faxed to [facility].    Edward Thomas, Clydene Pugh, LCSW

## 2018-08-06 NOTE — Progress Notes (Signed)
Subjective: Interval History: Patient presently offers no complaints.  Denies any difficulty breathing.  States that he is ready to go home.  Objective: Vital signs in last 24 hours: Temp:  [98.3 F (36.8 C)-99.6 F (37.6 C)] 98.3 F (36.8 C) (10/01 0535) Pulse Rate:  [89-104] 102 (10/01 0535) Resp:  [16] 16 (09/30 1150) BP: (134-178)/(96-114) 140/98 (10/01 0535) SpO2:  [99 %-100 %] 100 % (10/01 0535) Weight:  [73.9 kg-74 kg] 74 kg (10/01 0535) Weight change: 0 kg  Intake/Output from previous day: 09/30 0701 - 10/01 0700 In: 480 [P.O.:480] Out: 2400 [Urine:1400] Intake/Output this shift: No intake/output data recorded.  Generally patient is alert and in no apparent distress Chest: Is clear to auscultation. Heart exam revealed regular rate and rhythm no murmur Extremities no edema  Lab Results: Recent Labs    08/04/18 0623 08/05/18 0500  WBC 8.0 7.9  HGB 10.1* 9.4*  HCT 31.4* 29.4*  PLT 204 197   BMET:  Recent Labs    08/04/18 0623 08/05/18 0500  NA 136 137  K 3.5 3.5  CL 98 96*  CO2 26 31  GLUCOSE 92 107*  BUN 39* 45*  CREATININE 7.41* 8.01*  CALCIUM 8.6* 8.6*   No results for input(s): PTH in the last 72 hours. Iron Studies:  No results for input(s): IRON, TIBC, TRANSFERRIN, FERRITIN in the last 72 hours.  Studies/Results: No results found.  I have reviewed the patient's current medications.  Assessment/Plan: 1]  hypertension: His blood pressure seems to be reasonably controlled. 2] renal failure: He is status post dialysis dialysis yesterday.  Presently he does not have any uremic signs and symptoms. 3] anemia: His hemoglobin is stable.  Presently he is on Epogen. 4] bone mineral disorder: His calcium  remains in range but his phosphorus is high.  Patient is started on binder.  His phosphorus however has come down and presently 6.4. 5] left complex renal cyst which is incidentalfinding 6] severe LVH possibly hypertensive.  We are able to remove  about 1 L.  Patient however has 2400 cc of total output. 7] hyperuricemia: On allopurinol.  Denies any history of gout Plan: 1] patient does require dialysis today.  He will be dialyzed tomorrow  2]we will dialyze patient tomorrow for 31/2  hours 3] we will check CBC and renal panel in the morning 4] we will continue his other medication as before.   .(!) 140/98     LOS: 11 days   Murphy Duzan S 08/06/2018,8:07 AM

## 2018-09-02 ENCOUNTER — Ambulatory Visit: Payer: Self-pay | Admitting: Physician Assistant

## 2018-09-19 ENCOUNTER — Encounter: Payer: Self-pay | Admitting: Student

## 2018-09-19 ENCOUNTER — Ambulatory Visit (INDEPENDENT_AMBULATORY_CARE_PROVIDER_SITE_OTHER): Payer: Medicaid Other | Admitting: Student

## 2018-09-19 VITALS — BP 140/80 | HR 93 | Ht 72.0 in | Wt 185.0 lb

## 2018-09-19 DIAGNOSIS — Z01818 Encounter for other preprocedural examination: Secondary | ICD-10-CM

## 2018-09-19 DIAGNOSIS — N186 End stage renal disease: Secondary | ICD-10-CM | POA: Diagnosis not present

## 2018-09-19 DIAGNOSIS — I110 Hypertensive heart disease with heart failure: Secondary | ICD-10-CM | POA: Diagnosis not present

## 2018-09-19 DIAGNOSIS — I10 Essential (primary) hypertension: Secondary | ICD-10-CM

## 2018-09-19 DIAGNOSIS — I43 Cardiomyopathy in diseases classified elsewhere: Secondary | ICD-10-CM

## 2018-09-19 DIAGNOSIS — I5022 Chronic systolic (congestive) heart failure: Secondary | ICD-10-CM

## 2018-09-19 MED ORDER — FUROSEMIDE 80 MG PO TABS: 80.0000 mg | ORAL_TABLET | Freq: Every day | ORAL | 3 refills | Status: DC

## 2018-09-19 MED ORDER — HYDRALAZINE HCL 100 MG PO TABS: 100.0000 mg | ORAL_TABLET | Freq: Three times a day (TID) | ORAL | 3 refills | Status: DC

## 2018-09-19 MED ORDER — CARVEDILOL 25 MG PO TABS: 25.0000 mg | ORAL_TABLET | Freq: Two times a day (BID) | ORAL | 3 refills | Status: DC

## 2018-09-19 MED ORDER — AMLODIPINE BESYLATE 10 MG PO TABS: 10.0000 mg | ORAL_TABLET | Freq: Every day | ORAL | 3 refills | Status: DC

## 2018-09-19 MED ORDER — ISOSORBIDE MONONITRATE ER 60 MG PO TB24: 60.0000 mg | ORAL_TABLET | Freq: Every day | ORAL | 3 refills | Status: DC

## 2018-09-19 NOTE — Progress Notes (Signed)
Cardiology Office Note    Date:  09/19/2018   ID:  CHI GARLOW, DOB [birth year], MRN [id number]  PCP:  Patient, No Pcp Per  Cardiologist: Kate Sable, MD    Chief Complaint  Patient presents with  . Hospitalization Follow-up    History of Present Illness:    Edward Thomas is a 27 y.o. male with past medical history of uncontrolled hypertension and tobacco use who presents to the office today for hospital follow-up.  He was recently admitted to [facility] from 07/26/2018 to 08/06/2018 for worsening dyspnea and orthopnea over the past several days. BP was found to be significantly elevated to 251/154 while in the ED and D-dimer was elevated but VQ scan was low risk for a PE. BNP was elevated to 226 upon admission and he was also found to be in acute renal failure with creatinine at 9.55.  Echocardiogram was performed and showed a reduced EF of 20% with severe LVH, diffuse hypokinesis, and akinesis of the basal inferior myocardium. Was also noted to have mild AI, mild MR, and severely dilated left atrium. Cardiology was consulted and it was thought that his cardiomyopathy was secondary to his uncontrolled hypertension with plans for a repeat echocardiogram recommended in 2-3 months. He was started on Amlodipine 10 mg daily, Carvedilol 25 mg twice daily, Doxazosin 2mg  daily, Hydralazine 100 mg 3 times daily, Lasix 80mg  daily, and Imdur 60 mg daily. Nephrology followed the patient closely and he underwent tunneled dialysis catheter placement on 07/30/2018 and was started on hemodialysis during admission.  In talking with the patient and his mother today, he reports overall doing well since his recent hospitalization. He denies any repeat episodes of dyspnea or orthopnea. No recent chest pain, palpitations, dyspnea on exertion, PND, or lower extremity edema.  He has been undergoing hemodialysis on a MWF schedule and denies any recent issues with this. Reports that his weight has  overall been stable. He does not check blood pressure regularly at home but this is checked during hemodialysis and he denies any recent episodes of hypotension. BP is overall well controlled at 140/80 during today's visit.  He reports good compliance with his current medication regimen and sets an alarm on his phone to remember to take his medications. He denies any noted side effects to these.   Past Medical History:  Diagnosis Date  . Cardiomyopathy ([place])    a. 07/2018: EF reduced to 20% - thought to be due to uncontrolled HTN  . ESRD (end stage renal disease) ([place])   . Essential hypertension     Past Surgical History:  Procedure Laterality Date  . IR FLUORO GUIDE CV LINE RIGHT  07/30/2018  . IR US GUIDE VASC ACCESS RIGHT  07/30/2018    Current Medications: Outpatient Medications Prior to Visit  Medication Sig Dispense Refill  . doxazosin (CARDURA) 2 MG tablet Take 1 tablet (2 mg total) by mouth daily. 30 tablet 1  . sevelamer carbonate (RENVELA) 800 MG tablet Take 1 tablet (800 mg total) by mouth 3 (three) times daily with meals. 90 tablet 1  . amLODipine (NORVASC) 10 MG tablet Take 1 tablet (10 mg total) by mouth daily. 30 tablet 1  . carvedilol (COREG) 25 MG tablet Take 1 tablet (25 mg total) by mouth 2 (two) times daily with a meal. 60 tablet 1  . furosemide (LASIX) 80 MG tablet Take 1 tablet (80 mg total) by mouth daily. 30 tablet 1  . hydrALAZINE (APRESOLINE) 100 MG tablet  Take 1 tablet (100 mg total) by mouth 3 (three) times daily. 90 tablet 1  . isosorbide mononitrate (IMDUR) 60 MG 24 hr tablet Take 1 tablet (60 mg total) by mouth daily. 30 tablet 1   No facility-administered medications prior to visit.      Allergies:   Patient has no known allergies.   Social History   Socioeconomic History  . Marital status: Single    Spouse name: Not on file  . Number of children: Not on file  . Years of education: Not on file  . Highest education level: Not on file    Occupational History  . Not on file  Social Needs  . Financial resource strain: Not on file  . Food insecurity:    Worry: Not on file    Inability: Not on file  . Transportation needs:    Medical: Not on file    Non-medical: Not on file  Tobacco Use  . Smoking status: Never Smoker  . Smokeless tobacco: Never Used  Substance and Sexual Activity  . Alcohol use: Never    Frequency: Never  . Drug use: Never  . Sexual activity: Not on file  Lifestyle  . Physical activity:    Days per week: Not on file    Minutes per session: Not on file  . Stress: Not on file  Relationships  . Social connections:    Talks on phone: Not on file    Gets together: Not on file    Attends religious service: Not on file    Active member of club or organization: Not on file    Attends meetings of clubs or organizations: Not on file    Relationship status: Not on file  Other Topics Concern  . Not on file  Social History Narrative  . Not on file     Family History:  The patient's family history includes Hypertension in his mother.   Review of Systems:   Please see the history of present illness.     General:  No chills, fever, night sweats or weight changes.  Cardiovascular:  No chest pain, dyspnea on exertion, edema, orthopnea, palpitations, paroxysmal nocturnal dyspnea. Dermatological: No rash, lesions/masses Respiratory: No cough, Positive for dyspnea (now resolved).  Urologic: No hematuria, dysuria Abdominal:   No nausea, vomiting, diarrhea, bright red blood per rectum, melena, or hematemesis Neurologic:  No visual changes, wkns, changes in mental status.  All other systems reviewed and are otherwise negative except as noted above.   Physical Exam:    VS:  BP 140/80   Pulse 93   Ht 6' (1.829 m)   Wt 185 lb (83.9 kg)   SpO2 93%   BMI 25.09 kg/m    General: Well developed, well nourished [place] American male appearing in no acute distress. Head: Normocephalic, atraumatic, sclera  non-icteric, no xanthomas, nares are without discharge.  Neck: No carotid bruits. JVD not elevated.  Lungs: Respirations regular and unlabored, without wheezes or rales.  Heart: Regular rate and rhythm. No S3 or S4.  No murmur, no rubs, or gallops appreciated. Abdomen: Soft, non-tender, non-distended with normoactive bowel sounds. No hepatomegaly. No rebound/guarding. No obvious abdominal masses. Msk:  Strength and tone appear normal for age. No joint deformities or effusions. Extremities: No clubbing or cyanosis. No lower extremity edema.  Distal pedal pulses are 2+ bilaterally. Neuro: Alert and oriented X 3. Moves all extremities spontaneously. No focal deficits noted. Psych:  Responds to questions appropriately with a normal affect. Skin:  No rashes or lesions noted  Wt Readings from Last 3 Encounters:  09/19/18 185 lb (83.9 kg)  08/06/18 163 lb 2.3 oz (74 kg)     Studies/Labs Reviewed:   EKG:  EKG is not ordered today.   Recent Labs: 07/26/2018: ALT 30; B Natriuretic Peptide 2,260.0 08/05/2018: BUN 45; Creatinine, Ser 8.01; Hemoglobin 9.4; Platelets 197; Potassium 3.5; Sodium 137   Lipid Panel No results found for: CHOL, TRIG, HDL, CHOLHDL, VLDL, LDLCALC, LDLDIRECT  Additional studies/ records that were reviewed today include:   Echocardiogram: 07/2018 Study Conclusions  - Left ventricle: The cavity size was normal. Wall thickness was   increased in a pattern of severe LVH. The estimated ejection   fraction was 20%. Diffuse hypokinesis. There is akinesis of the   basalinferior myocardium. Doppler parameters are consistent with   restrictive physiology, indicative of decreased left ventricular   diastolic compliance and/or increased left atrial pressure.   Consider hypertrophic, infiltrative, or hypertensive   cardiomyopathy in differential. - Aortic valve: There was mild regurgitation. - Mitral valve: There was mild regurgitation. - Left atrium: The atrium was severely  dilated. - Right ventricle: Systolic function was mildly reduced. - Right atrium: Central venous pressure (est): 8 mm Hg. - Atrial septum: No defect or patent foramen ovale was identified. - Tricuspid valve: There was mild regurgitation. - Pulmonary arteries: Systolic pressure was moderately increased.   PA peak pressure: 45 mm Hg (S). - Pericardium, extracardiac: A trivial pericardial effusion was   identified.  Assessment:    1. Cardiomyopathy due to hypertension, with heart failure ([place])   2. Chronic systolic heart failure ([place])   3. Essential hypertension   4. ESRD (end stage renal disease) ([place])   5. Preoperative clearance      Plan:   In order of problems listed above:  1. Hypertensive Cardiomyopathy/ Chronic Systolic CHF - Recently admitted for worsening dyspnea and orthopnea, found to have significantly elevated BP as outlined above and to be in acute renal failure, leading to the initiation of hemodialysis. EF was reduced at 20% and severe LVH was noted by echocardiogram. His cardiomyopathy was thought to be due to uncontrolled hypertension and no further ischemic evaluation was pursued. - He has overall been doing very well since hospital discharge and denies any recent chest pain, dyspnea on exertion, orthopnea, PND, or lower extremity edema.  Appears euvolemic by examination today.  - will continue on Coreg, Hydralazine, and Imdur at current dosing. Not a candidate for ACE-I/ARB/ARNI given his ESRD. Will plan for repeat echo in 10/2018 once 3 months out from his hospitalization.   2. HTN - BP is well-controlled at 140/80 during today's visit. Patient is unsure is he has been having episodes of hypotension during HD and I encouraged him to inquire about his readings.  - for now, will continue current medication regimen of Amlodipine 10 mg daily, Carvedilol 25 mg twice daily, Doxazosin 2mg  daily, Hydralazine 100 mg 3 times daily, Lasix 80mg  daily (has been continued per  Nephrology), and Imdur 60 mg daily. Imdur could be further titrated to BID dosing in the future if additional BP control is needed.   3. ESRD/ Preoperative Cardiac Clearance for Fistula Placement - followed by Dr. Theador Hawthorne. On HD - MWF schedule.  - He is planning to undergo AV fistula placement and no further cardiac testing would be indicated prior to the procedure as it is overall low risk from a cardiac perspective. Will plan to obtain a repeat echocardiogram in 10/2018  but this does not need to be performed prior to his surgery. He is not currently on ASA or anticoagulation, therefore no specific cardiac medications would need to be held in the days leading up to surgery.   Medication Adjustments/Labs and Tests Ordered: Current medicines are reviewed at length with the patient today.  Concerns regarding medicines are outlined above.  Medication changes, Labs and Tests ordered today are listed in the Patient Instructions below. Patient Instructions  Medication Instructions:  Your physician recommends that you continue on your current medications as directed. Please refer to the Current Medication list given to you today.  If you need a refill on your cardiac medications before your next appointment, please call your pharmacy.   Lab work: NONE   If you have labs (blood work) drawn today and your tests are completely normal, you will receive your results only by: Marland Kitchen MyChart Message (if you have MyChart) OR . A paper copy in the mail If you have any lab test that is abnormal or we need to change your treatment, we will call you to review the results.  Testing/Procedures: Your physician has requested that you have an echocardiogram. Echocardiography is a painless test that uses sound waves to create images of your heart. It provides your doctor with information about the size and shape of your heart and how well your heart's chambers and valves are working. This procedure takes approximately one  hour. There are no restrictions for this procedure.   Follow-Up: At [facility], you and your health needs are our priority.  As part of our continuing mission to provide you with exceptional heart care, we have created designated Provider Care Teams.  These Care Teams include your primary Cardiologist (physician) and Advanced Practice Providers (APPs -  Physician Assistants and Nurse Practitioners) who all work together to provide you with the care you need, when you need it. You will need a follow up appointment in 2-3 months.  Please call our office 2 months in advance to schedule this appointment.  You may see Kate Sable, MD or one of the following Advanced Practice Providers on your designated Care Team:   Bernerd Pho, PA-C [facility]) . Ermalinda Barrios, PA-C ([place])  Any Other Special Instructions Will Be Listed Below (If Applicable). Thank you for choosing [facility]!    Signed, Erma Heritage, PA-C  09/19/2018 5:34 PM    [place] [address] [place],  [place] [address] Phone: [phone]

## 2018-09-19 NOTE — Patient Instructions (Signed)
Medication Instructions:  Your physician recommends that you continue on your current medications as directed. Please refer to the Current Medication list given to you today.  If you need a refill on your cardiac medications before your next appointment, please call your pharmacy.   Lab work: NONE   If you have labs (blood work) drawn today and your tests are completely normal, you will receive your results only by: Marland Kitchen MyChart Message (if you have MyChart) OR . A paper copy in the mail If you have any lab test that is abnormal or we need to change your treatment, we will call you to review the results.  Testing/Procedures: Your physician has requested that you have an echocardiogram. Echocardiography is a painless test that uses sound waves to create images of your heart. It provides your doctor with information about the size and shape of your heart and how well your heart's chambers and valves are working. This procedure takes approximately one hour. There are no restrictions for this procedure.   Follow-Up: At [facility], you and your health needs are our priority.  As part of our continuing mission to provide you with exceptional heart care, we have created designated Provider Care Teams.  These Care Teams include your primary Cardiologist (physician) and Advanced Practice Providers (APPs -  Physician Assistants and Nurse Practitioners) who all work together to provide you with the care you need, when you need it. You will need a follow up appointment in 2-3 months.  Please call our office 2 months in advance to schedule this appointment.  You may see Kate Sable, MD or one of the following Advanced Practice Providers on your designated Care Team:   Bernerd Pho, PA-C [facility]) . Ermalinda Barrios, PA-C ([place])  Any Other Special Instructions Will Be Listed Below (If Applicable). Thank you for choosing [facility]!

## 2018-11-01 ENCOUNTER — Other Ambulatory Visit (HOSPITAL_COMMUNITY): Payer: Medicaid Other

## 2018-11-25 ENCOUNTER — Ambulatory Visit (HOSPITAL_COMMUNITY)
Admission: RE | Admit: 2018-11-25 | Discharge: 2018-11-25 | Disposition: A | Discharge status: Home / Self Care | Payer: Medicaid Other | Source: Ambulatory Visit | Attending: Student | Admitting: Student

## 2018-11-25 DIAGNOSIS — I110 Hypertensive heart disease with heart failure: Secondary | ICD-10-CM | POA: Diagnosis not present

## 2018-11-25 DIAGNOSIS — I43 Cardiomyopathy in diseases classified elsewhere: Secondary | ICD-10-CM | POA: Diagnosis present

## 2018-11-25 NOTE — Progress Notes (Signed)
*  PRELIMINARY RESULTS* Echocardiogram 2D Echocardiogram has been performed.  Edward Thomas 11/25/2018, 3:49 PM

## 2018-12-09 ENCOUNTER — Ambulatory Visit: Payer: Medicaid Other | Admitting: Cardiovascular Disease

## 2019-03-13 ENCOUNTER — Ambulatory Visit: Payer: Self-pay | Admitting: Cardiovascular Disease

## 2019-04-15 ENCOUNTER — Ambulatory Visit: Payer: Self-pay | Admitting: Cardiovascular Disease

## 2019-06-16 ENCOUNTER — Ambulatory Visit: Payer: Self-pay | Admitting: Cardiovascular Disease

## 2019-07-18 ENCOUNTER — Ambulatory Visit (INDEPENDENT_AMBULATORY_CARE_PROVIDER_SITE_OTHER): Payer: Medicaid Other | Admitting: Cardiovascular Disease

## 2019-07-18 ENCOUNTER — Telehealth: Payer: Self-pay | Admitting: Licensed Clinical Social Worker

## 2019-07-18 ENCOUNTER — Encounter: Payer: Self-pay | Admitting: Cardiovascular Disease

## 2019-07-18 ENCOUNTER — Other Ambulatory Visit: Payer: Self-pay

## 2019-07-18 VITALS — BP 150/96 | HR 74 | Temp 98.3°F | Ht 72.0 in | Wt 196.0 lb

## 2019-07-18 DIAGNOSIS — I10 Essential (primary) hypertension: Secondary | ICD-10-CM

## 2019-07-18 DIAGNOSIS — I5022 Chronic systolic (congestive) heart failure: Secondary | ICD-10-CM

## 2019-07-18 DIAGNOSIS — I110 Hypertensive heart disease with heart failure: Secondary | ICD-10-CM | POA: Diagnosis not present

## 2019-07-18 DIAGNOSIS — Z992 Dependence on renal dialysis: Secondary | ICD-10-CM

## 2019-07-18 DIAGNOSIS — N186 End stage renal disease: Secondary | ICD-10-CM

## 2019-07-18 DIAGNOSIS — I43 Cardiomyopathy in diseases classified elsewhere: Secondary | ICD-10-CM

## 2019-07-18 NOTE — Telephone Encounter (Signed)
CSW referred to assist patient with obtaining a BP cuff. CSW contacted patient to inform cuff will be delivered to home. Message left. CSW available as needed. Jackie Mahoganie Basher, LCSW, CCSW-MCS [phone]  

## 2019-07-18 NOTE — Progress Notes (Signed)
SUBJECTIVE: The patient presents for follow-up of hypertensive cardiomyopathy.  He also has end-stage renal disease and is on hemodialysis.   Echocardiogram on 11/25/2018 demonstrated mildly reduced left ventricular systolic function, EF 45 to [id number], normal diastolic function, mild left ventricular dilatation, moderate LVH, and mild left atrial dilatation.  Previous echocardiogram on 07/26/2018 showed an EF of 20%.  The patient denies any symptoms of chest pain, palpitations, shortness of breath, lightheadedness, dizziness, leg swelling, orthopnea, PND, and syncope.  His blood pressure is elevated in our office but he said he is due for his medications.   Review of Systems: As per "subjective", otherwise negative.  No Known Allergies  Current Outpatient Medications  Medication Sig Dispense Refill  . amLODipine (NORVASC) 10 MG tablet Take 1 tablet (10 mg total) by mouth daily. 90 tablet 3  . carvedilol (COREG) 25 MG tablet Take 1 tablet (25 mg total) by mouth 2 (two) times daily with a meal. 180 tablet 3  . doxazosin (CARDURA) 2 MG tablet Take 1 tablet (2 mg total) by mouth daily. 30 tablet 1  . furosemide (LASIX) 80 MG tablet Take 1 tablet (80 mg total) by mouth daily. 90 tablet 3  . hydrALAZINE (APRESOLINE) 100 MG tablet Take 1 tablet (100 mg total) by mouth 3 (three) times daily. 270 tablet 3  . isosorbide mononitrate (IMDUR) 60 MG 24 hr tablet Take 1 tablet (60 mg total) by mouth daily. 90 tablet 3  . sevelamer carbonate (RENVELA) 800 MG tablet Take 1 tablet (800 mg total) by mouth 3 (three) times daily with meals. 90 tablet 1   No current facility-administered medications for this visit.     Past Medical History:  Diagnosis Date  . Cardiomyopathy ([place])    a. 07/2018: EF reduced to 20% - thought to be due to uncontrolled HTN  . ESRD (end stage renal disease) ([place])   . Essential hypertension     Past Surgical History:  Procedure Laterality Date  . IR FLUORO GUIDE CV  LINE RIGHT  07/30/2018  . IR US GUIDE VASC ACCESS RIGHT  07/30/2018    Social History   Socioeconomic History  . Marital status: Single    Spouse name: Not on file  . Number of children: Not on file  . Years of education: Not on file  . Highest education level: Not on file  Occupational History  . Not on file  Social Needs  . Financial resource strain: Not on file  . Food insecurity    Worry: Not on file    Inability: Not on file  . Transportation needs    Medical: Not on file    Non-medical: Not on file  Tobacco Use  . Smoking status: Never Smoker  . Smokeless tobacco: Never Used  Substance and Sexual Activity  . Alcohol use: Never    Frequency: Never  . Drug use: Never  . Sexual activity: Not on file  Lifestyle  . Physical activity    Days per week: Not on file    Minutes per session: Not on file  . Stress: Not on file  Relationships  . Social Herbalist on phone: Not on file    Gets together: Not on file    Attends religious service: Not on file    Active member of club or organization: Not on file    Attends meetings of clubs or organizations: Not on file    Relationship status: Not on file  .  Intimate partner violence    Fear of current or ex partner: Not on file    Emotionally abused: Not on file    Physically abused: Not on file    Forced sexual activity: Not on file  Other Topics Concern  . Not on file  Social History Narrative  . Not on file     Vitals:   07/18/19 1349  BP: (!) 150/96  Pulse: 74  Temp: 98.3 F (36.8 C)  SpO2: 96%  Weight: 196 lb (88.9 kg)  Height: 6' (1.829 m)    Wt Readings from Last 3 Encounters:  07/18/19 196 lb (88.9 kg)  09/19/18 185 lb (83.9 kg)  08/06/18 163 lb 2.3 oz (74 kg)     PHYSICAL EXAM General: NAD HEENT: Normal. Neck: No JVD, no thyromegaly. Lungs: Clear to auscultation bilaterally with normal respiratory effort. CV: Regular rate and rhythm, normal S1/S2, no S3/S4, no murmur. No pretibial  or periankle edema.   Abdomen: Soft, nontender, no distention.  Neurologic: Alert and oriented.  Psych: Normal affect. Skin: Normal. Musculoskeletal: No gross deformities.      Labs: Lab Results  Component Value Date/Time   K 3.5 08/05/2018 05:00 AM   BUN 45 (H) 08/05/2018 05:00 AM   CREATININE 8.01 (H) 08/05/2018 05:00 AM   ALT 30 07/26/2018 09:58 AM   HGB 9.4 (L) 08/05/2018 05:00 AM     Lipids: No results found for: LDLCALC, LDLDIRECT, CHOL, TRIG, HDL     ASSESSMENT AND PLAN: 1.  Hypertensive cardiomyopathy/chronic systolic heart failure: Symptomatically stable.  Volume management per hemodialysis.  Nephrology has also prescribed Lasix 80 mg daily.  Continue carvedilol and hydralazine along with long-acting nitrates. I am unable to add ACE inhibitors, angiotensin receptor blockers, or angiotensin receptor-neprilysin inhibitors due to advanced chronic kidney disease.  2.  Hypertension: Blood pressure is elevated but he is due for his medications.  Imdur could be increased to 60 mg twice daily if further antihypertensive titration is needed.  This will need further monitoring.  We will help assist him in obtaining a blood pressure cuff.  I will have him check his blood pressure several times per week over the next 4 weeks to see if Imdur indeed needs to be increased to 60 mg twice daily.  3.  End-stage renal disease: On hemodialysis.     Disposition: Follow up 6 months   Kate Sable, M.D., F.A.C.C.

## 2019-07-18 NOTE — Patient Instructions (Signed)
Medication Instructions: Your physician recommends that you continue on your current medications as directed. Please refer to the Current Medication list given to you today.   Labwork: None today  Procedures/Testing: None today  Follow-Up: 6 months with [place], PA-C  Any Additional Special Instructions Will Be Listed Below (If Applicable).   Take blood pressure 3-4 times a week for 1 month, then, drop log off at office  If you need a refill on your cardiac medications before your next appointment, please call your pharmacy.

## 2019-09-29 ENCOUNTER — Other Ambulatory Visit: Payer: Self-pay | Admitting: Student

## 2019-12-29 ENCOUNTER — Other Ambulatory Visit: Payer: Self-pay | Admitting: Cardiovascular Disease

## 2020-01-21 IMAGING — US US RENAL
1 series · 14 of 25 positions shown · non-contrast
Comparison: 07/26/2018.

CLINICAL DATA: Renal failure.

EXAM:
RENAL / URINARY TRACT ULTRASOUND COMPLETE

[Series 1: us renal · 0.25mm/px · 14 of 50 slices shown]
[im 1/50]
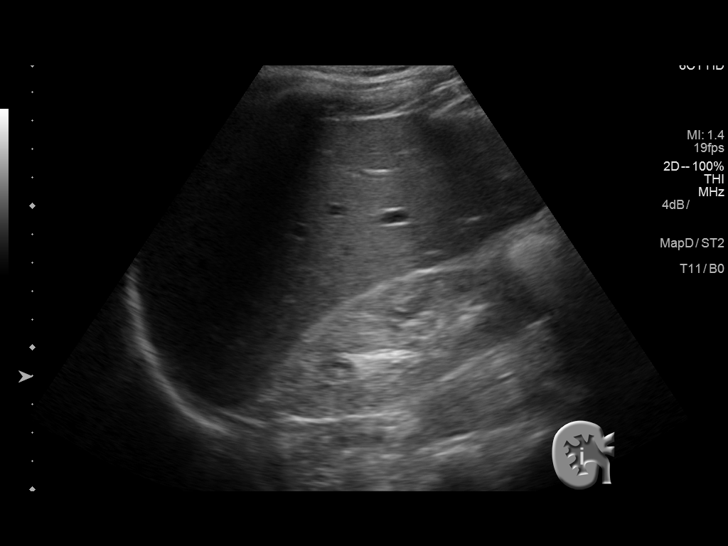
[im 5/50]
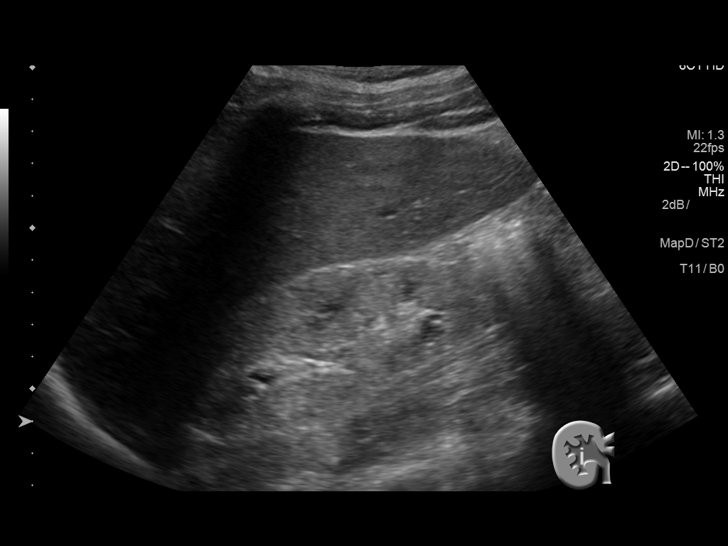
[im 9/50]
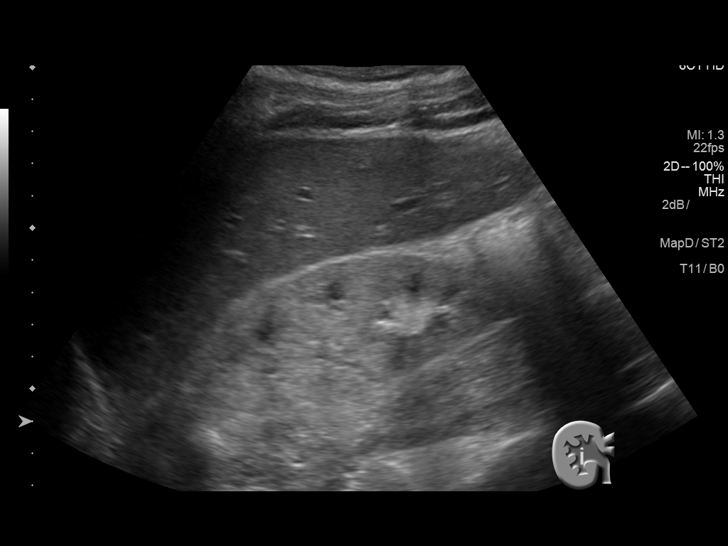
[im 13/50]
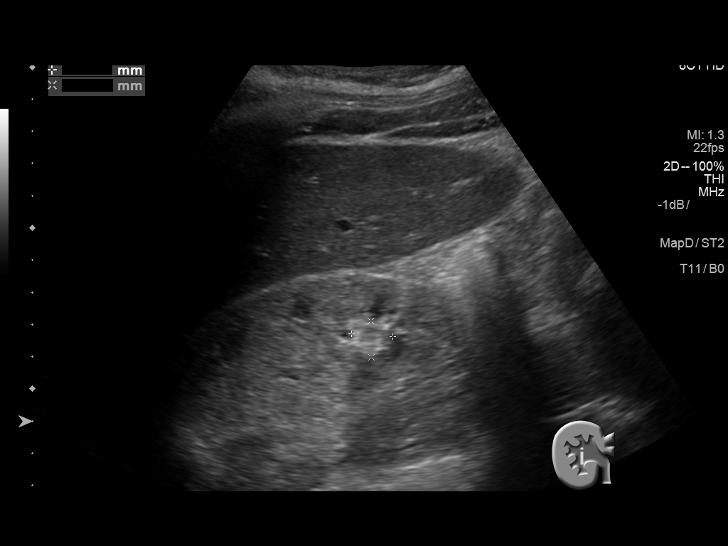
[im 17/50]
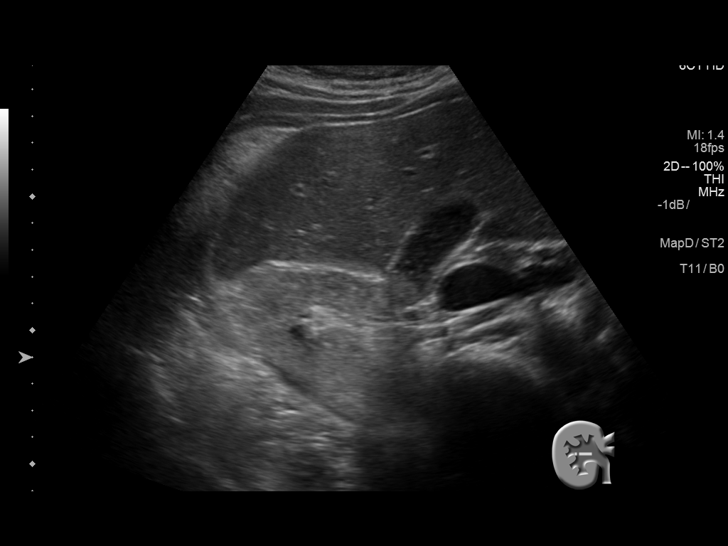
[im 19/50]
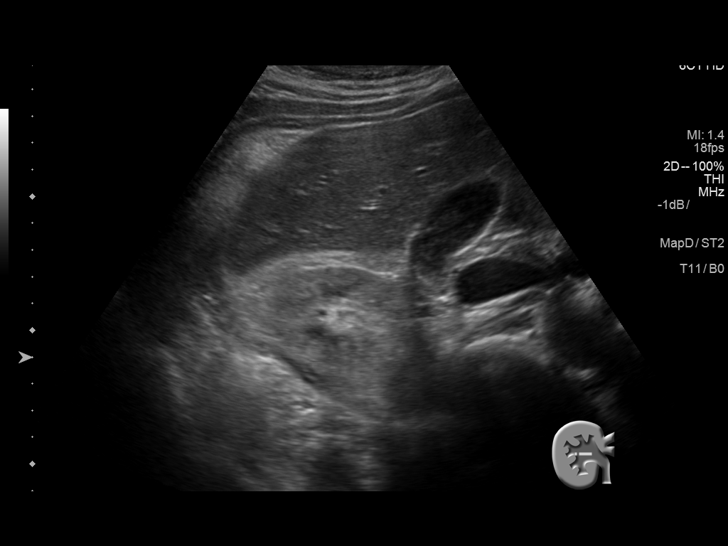
[im 23/50]
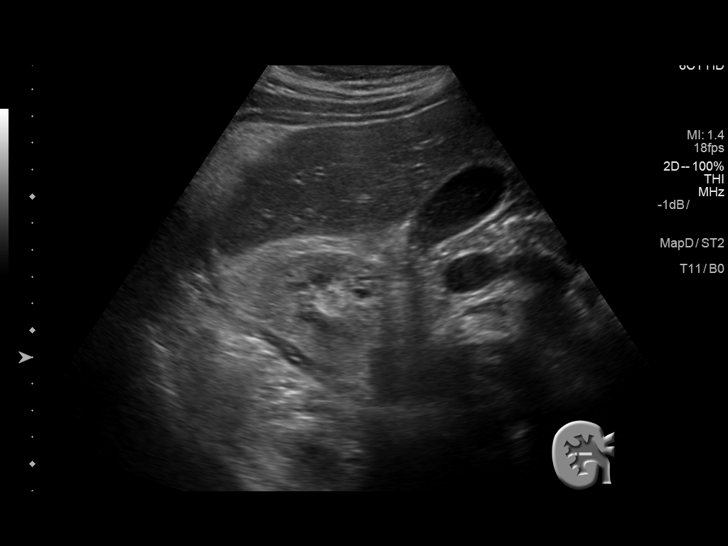
[im 27/50]
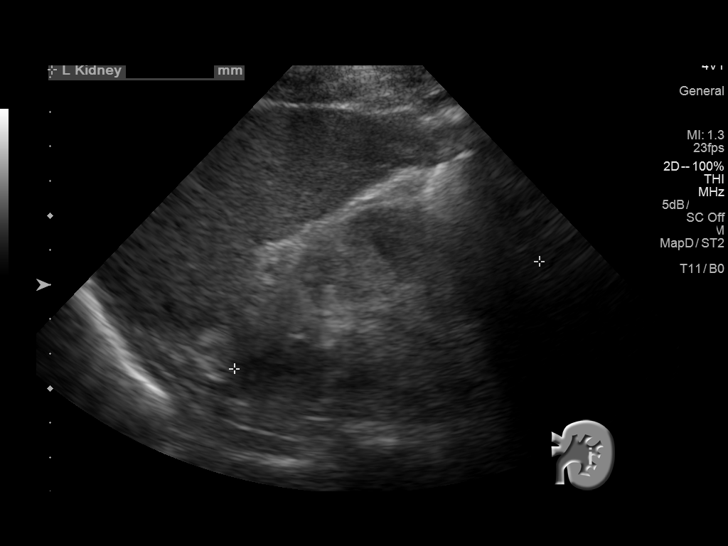
[im 31/50]
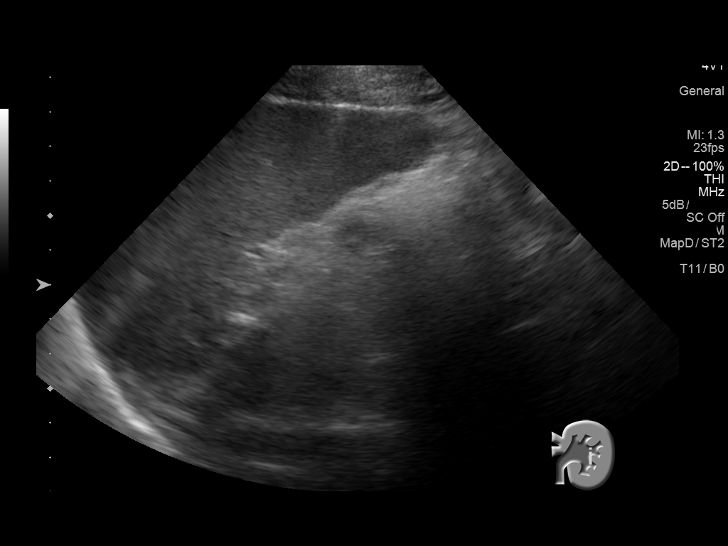
[im 33/50]
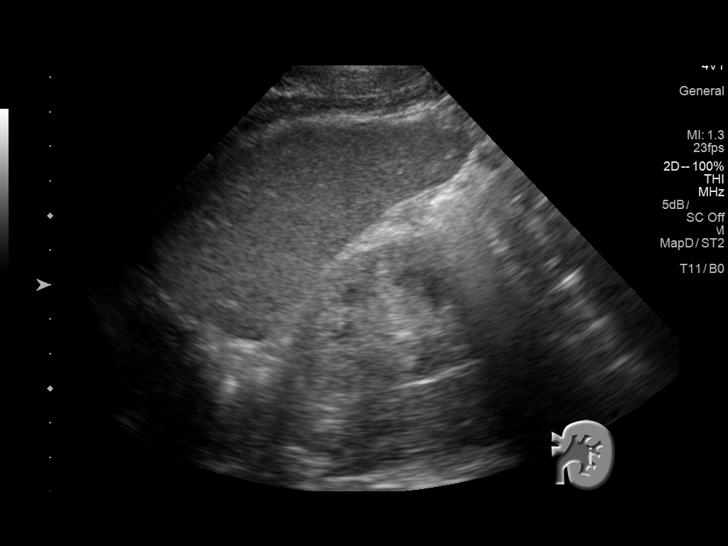
[im 37/50]
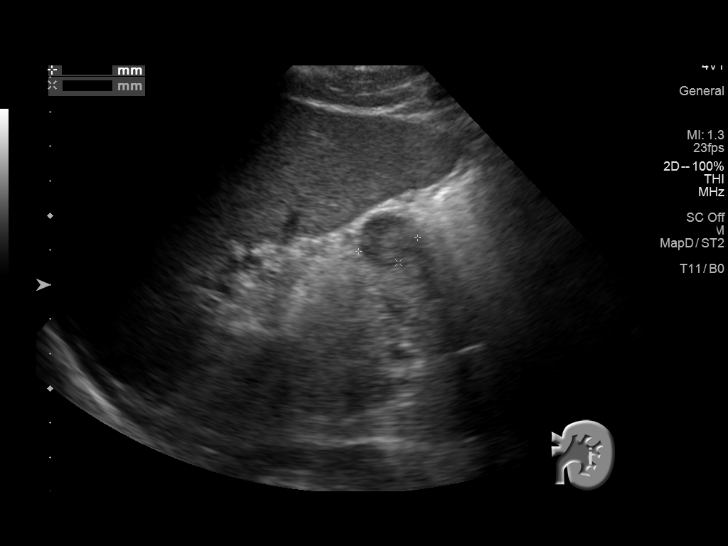
[im 41/50]
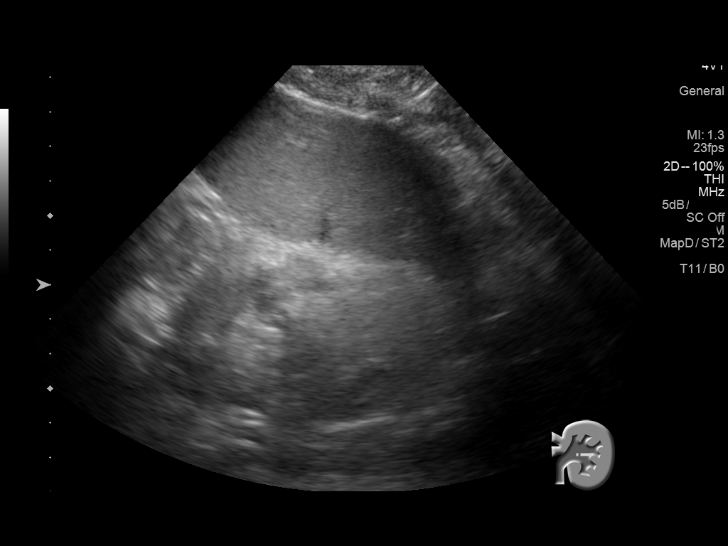
[im 45/50]
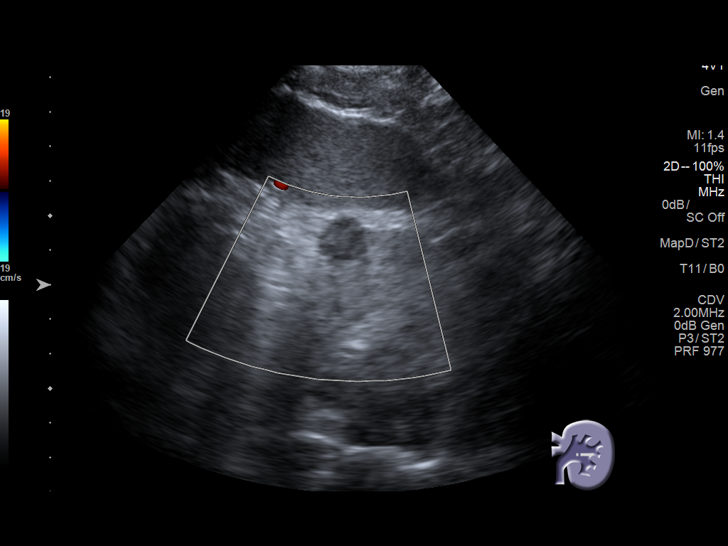
[im 50/50]
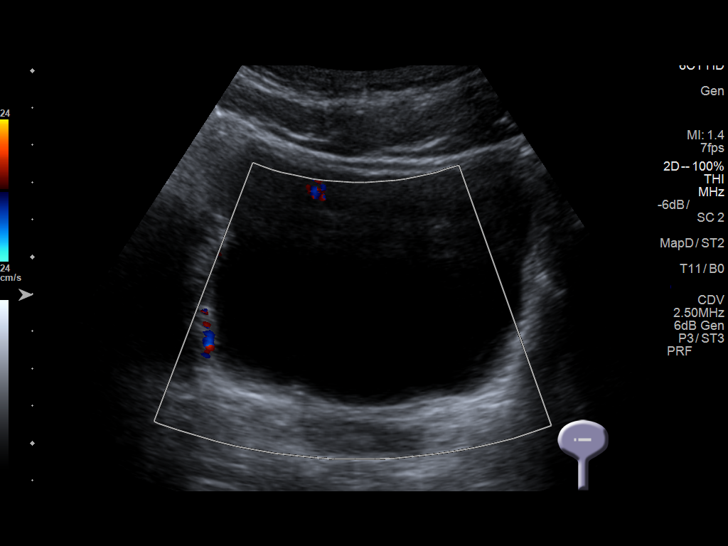

[14 of 25 positions shown; findings below may reference images not displayed]

FINDINGS: Right Kidney:

Length: 10.9 cm. Increased echogenicity. 1.7 cm hyperechoic
shadowing density noted the midportion of the right kidney. This
could be a calcified renal mass. Large renal stone could also
present this fashion. No hydronephrosis visualized.

Left Kidney:

Length: 9.4 cm. Increased echogenicity. 1.9 cm complex cystic versus
solid mass noted the midportion of the left kidney. No
hydronephrosis visualized.

Bladder:

Appears normal for degree of bladder distention.
IMPRESSION: 1. Increased echogenicity both kidneys consistent with chronic
medical renal disease.

2. 1.7 cm hyperechoic shadowing density in the midportion of the
right kidney. This could represent a calcified renal mass. Large
renal stone could present in this fashion. 1.9 cm complex cystic
versus solid mass in the midportion of the left kidney. A renal
tumor in either kidney cannot be excluded. For further evaluation
MRI of the kidneys suggested.

## 2020-01-23 ENCOUNTER — Telehealth: Payer: Self-pay

## 2020-01-23 NOTE — Telephone Encounter (Signed)

## 2020-01-28 ENCOUNTER — Telehealth: Payer: Medicaid Other | Admitting: Cardiology

## 2020-03-03 ENCOUNTER — Telehealth (INDEPENDENT_AMBULATORY_CARE_PROVIDER_SITE_OTHER): Payer: Medicaid Other | Admitting: Cardiovascular Disease

## 2020-03-03 ENCOUNTER — Encounter: Payer: Self-pay | Admitting: Cardiovascular Disease

## 2020-03-03 VITALS — BP 173/103 | HR 88 | Ht 72.0 in | Wt 211.8 lb

## 2020-03-03 DIAGNOSIS — N186 End stage renal disease: Secondary | ICD-10-CM | POA: Diagnosis not present

## 2020-03-03 DIAGNOSIS — I132 Hypertensive heart and chronic kidney disease with heart failure and with stage 5 chronic kidney disease, or end stage renal disease: Secondary | ICD-10-CM

## 2020-03-03 DIAGNOSIS — I5022 Chronic systolic (congestive) heart failure: Secondary | ICD-10-CM

## 2020-03-03 DIAGNOSIS — I43 Cardiomyopathy in diseases classified elsewhere: Secondary | ICD-10-CM | POA: Diagnosis not present

## 2020-03-03 DIAGNOSIS — I10 Essential (primary) hypertension: Secondary | ICD-10-CM

## 2020-03-03 DIAGNOSIS — I110 Hypertensive heart disease with heart failure: Secondary | ICD-10-CM

## 2020-03-03 DIAGNOSIS — Z992 Dependence on renal dialysis: Secondary | ICD-10-CM

## 2020-03-03 NOTE — Patient Instructions (Signed)
Medication Instructions:  Your physician recommends that you continue on your current medications as directed. Please refer to the Current Medication list given to you today.  *If you need a refill on your cardiac medications before your next appointment, please call your pharmacy*   Lab Work: NONE   If you have labs (blood work) drawn today and your tests are completely normal, you will receive your results only by: . MyChart Message (if you have MyChart) OR . A paper copy in the mail If you have any lab test that is abnormal or we need to change your treatment, we will call you to review the results.   Testing/Procedures: NONE   Follow-Up: At [facility], you and your health needs are our priority.  As part of our continuing mission to provide you with exceptional heart care, we have created designated Provider Care Teams.  These Care Teams include your primary Cardiologist (physician) and Advanced Practice Providers (APPs -  Physician Assistants and Nurse Practitioners) who all work together to provide you with the care you need, when you need it.  We recommend signing up for the patient portal called "MyChart".  Sign up information is provided on this After Visit Summary.  MyChart is used to connect with patients for Virtual Visits (Telemedicine).  Patients are able to view lab/test results, encounter notes, upcoming appointments, etc.  Non-urgent messages can be sent to your provider as well.   To learn more about what you can do with MyChart, go to [email or web address].    Your next appointment:   6 month(s)  The format for your next appointment:   In Person  Provider:   Suresh Koneswaran, MD   Other Instructions Thank you for choosing [facility]!    

## 2020-03-03 NOTE — Progress Notes (Signed)
Virtual Visit via Telephone Note   This visit type was conducted due to national recommendations for restrictions regarding the COVID-19 Pandemic (e.g. social distancing) in an effort to limit this patient's exposure and mitigate transmission in our community.  Due to his co-morbid illnesses, this patient is at least at moderate risk for complications without adequate follow up.  This format is felt to be most appropriate for this patient at this time.  The patient did not have access to video technology/had technical difficulties with video requiring transitioning to audio format only (telephone).  All issues noted in this document were discussed and addressed.  No physical exam could be performed with this format.  Please refer to the patient's chart for his  consent to telehealth for [facility].   The patient was identified using 2 identifiers.  Date:  03/03/2020   ID:  Edward Thomas, DOB 10/24/1991, MRN [id number]  Patient Location: Home Provider Location: Home  PCP:  Patient, No Pcp Per  Cardiologist:  Kate Sable, MD  Electrophysiologist:  None   Evaluation Performed:  Follow-Up Visit  Chief Complaint:  Hypertensive cardiomyopathy  History of Present Illness:    Edward Thomas is a 29 y.o. 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  He also has end-stage renal disease and is on hemodialysis.  Echocardiogram on 11/25/2018 demonstrated mildly reduced left ventricular systolic function, EF 45 to 82%, normal diastolic function, mild left ventricular dilatation, moderate LVH, and mild left atrial dilatation.  Previous echocardiogram on 07/26/2018 showed an EF of 20%.  BP's at dialysis have been normal. He has been rushing around this morning and is out of his normal routine. It's been the 130-140/80 range overall.  The patient denies any symptoms of chest pain, palpitations, shortness of breath, lightheadedness, dizziness, leg swelling, orthopnea, PND, and  syncope.    Past Medical History:  Diagnosis Date  . Cardiomyopathy ([place])    a. 07/2018: EF reduced to 20% - thought to be due to uncontrolled HTN  . ESRD (end stage renal disease) ([place])   . Essential hypertension    Past Surgical History:  Procedure Laterality Date  . IR FLUORO GUIDE CV LINE RIGHT  07/30/2018  . IR US GUIDE VASC ACCESS RIGHT  07/30/2018     Current Meds  Medication Sig  . amLODipine (NORVASC) 10 MG tablet Take 1 tablet by mouth once daily  . carvedilol (COREG) 25 MG tablet TAKE 1 TABLET BY MOUTH TWICE DAILY WITH MEALS  . doxazosin (CARDURA) 2 MG tablet Take 1 tablet (2 mg total) by mouth daily.  . furosemide (LASIX) 80 MG tablet Take 1 tablet by mouth once daily  . hydrALAZINE (APRESOLINE) 100 MG tablet TAKE 1 TABLET BY MOUTH THREE TIMES DAILY  . isosorbide mononitrate (IMDUR) 60 MG 24 hr tablet Take 1 tablet by mouth once daily  . sevelamer carbonate (RENVELA) 800 MG tablet Take 1 tablet (800 mg total) by mouth 3 (three) times daily with meals.     Allergies:   Patient has no known allergies.   Social History   Tobacco Use  . Smoking status: Former Research scientist (life sciences)  . Smokeless tobacco: Never Used  Substance Use Topics  . Alcohol use: Never  . Drug use: Never     Family Hx: The patient's family history includes Hypertension in his mother.  ROS:   Please see the history of present illness.     All other systems reviewed and are negative.   Prior CV studies:   The following  studies were reviewed today:  Reviewed above  Labs/Other Tests and Data Reviewed:    EKG:  No ECG reviewed.  Recent Labs: No results found for requested labs within last 8760 hours.   Recent Lipid Panel No results found for: CHOL, TRIG, HDL, CHOLHDL, LDLCALC, LDLDIRECT  Wt Readings from Last 3 Encounters:  03/03/20 211 lb 12.8 oz (96.1 kg)  07/18/19 196 lb (88.9 kg)  09/19/18 185 lb (83.9 kg)     Objective:    Vital Signs:  BP (!) 173/103   Pulse 88   Ht 6' (1.829 m)    Wt 211 lb 12.8 oz (96.1 kg)   BMI 28.73 kg/m    VITAL SIGNS:  reviewed  ASSESSMENT & PLAN:    1.  Hypertensive cardiomyopathy/chronic systolic heart failure: Symptomatically stable.  Volume management per hemodialysis.  Nephrology has also prescribed Lasix 80 mg daily.  Continue medications as listed above. I am unable to add ACE inhibitors, angiotensin receptor blockers, or angiotensin receptor-neprilysin inhibitors due to advanced chronic kidney disease.  2.  Hypertension: Blood pressure is elevated but he has been rushing around this morning and I am told he is out of his normal routine.  They have been checking blood pressures at dialysis and at home and they generally run in the 130-140/80-90 range.  This will need further monitoring.    3.  End-stage renal disease: On hemodialysis.    COVID-19 Education: The signs and symptoms of COVID-19 were discussed with the patient and how to seek care for testing (follow up with PCP or arrange E-visit).  The importance of social distancing was discussed today.  Time:   Today, I have spent 15 minutes with the patient with telehealth technology discussing the above problems.     Medication Adjustments/Labs and Tests Ordered: Current medicines are reviewed at length with the patient today.  Concerns regarding medicines are outlined above.   Tests Ordered: No orders of the defined types were placed in this encounter.   Medication Changes: No orders of the defined types were placed in this encounter.   Follow Up:  In Person in 6 month(s)  Signed, Kate Sable, MD  03/03/2020 8:33 AM    [place]

## 2020-08-06 ENCOUNTER — Ambulatory Visit: Payer: Medicaid Other | Admitting: Cardiovascular Disease

## 2020-08-11 ENCOUNTER — Telehealth: Payer: Medicaid Other | Admitting: Cardiology

## 2020-09-10 IMAGING — XA IR FLUORO GUIDE CV LINE*R*
1 series · 1 of 1 positions shown · non-contrast
Comparison: none

INDICATION: 27-year-old male with progressive chronic kidney disease now in need
of hemodialysis. He presents for placement of a tunneled
hemodialysis catheter.

[Series 1: fl (-) angio · 1 of 1 slices shown]
[im 1/1]
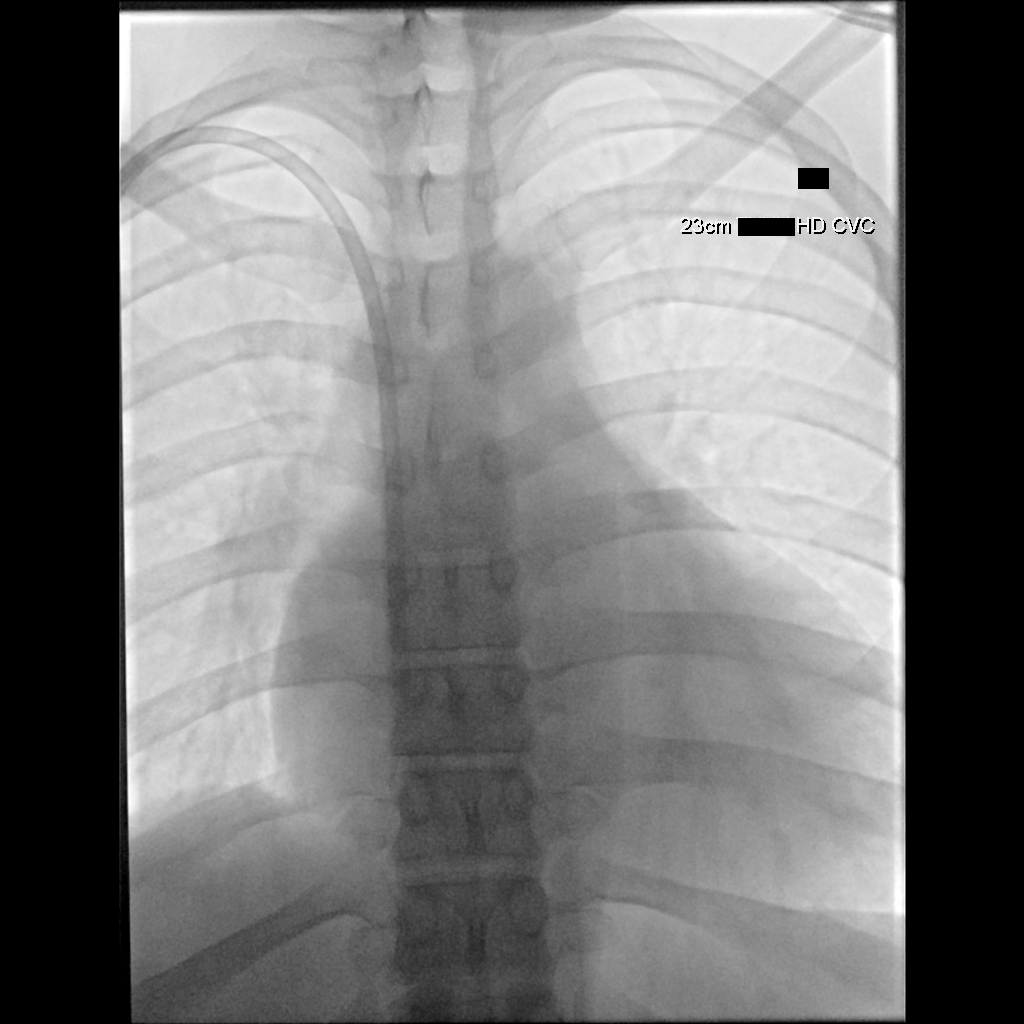

[1 of 1 positions shown; findings below may reference images not displayed]

EXAM:
TUNNELED CENTRAL VENOUS HEMODIALYSIS CATHETER PLACEMENT WITH
ULTRASOUND AND FLUOROSCOPIC GUIDANCE

MEDICATIONS:
2 g Ancef. The antibiotic was given in an appropriate time interval
prior to skin puncture.

ANESTHESIA/SEDATION:
Moderate (conscious) sedation was employed during this procedure. A
total of Versed 2 mg and Fentanyl 100 mcg was administered
intravenously.

Moderate Sedation Time: 20 minutes. The patient's level of
consciousness and vital signs were monitored continuously by
radiology nursing throughout the procedure under my direct
supervision.

FLUOROSCOPY TIME:  Fluoroscopy Time: 1 minutes 6 seconds (3 mGy).

COMPLICATIONS:
None immediate.



After creating a small venotomy incision, a micropuncture kit was
utilized to access the right internal jugular vein under direct,
real-time ultrasound guidance after the overlying soft tissues were
anesthetized with 1% lidocaine with epinephrine. Ultrasound image
documentation was performed. The microwire was kinked to measure
appropriate catheter length. A stiff Glidewire was advanced to the
level of the IVC and the micropuncture sheath was exchanged for a
peel-away sheath. A palindrome tunneled hemodialysis catheter
measuring 23 cm from tip to cuff was tunneled in a retrograde
fashion from the anterior chest wall to the venotomy incision.

The catheter was then placed through the peel-away sheath with tips
ultimately positioned within the superior aspect of the right
atrium. Final catheter positioning was confirmed and documented with
a spot radiographic image. The catheter aspirates and flushes
normally. The catheter was flushed with appropriate volume heparin
dwells.

The catheter exit site was secured with a 0-Prolene retention
suture. The venotomy incision was closed with Dermabond. Dressings
were applied. The patient tolerated the procedure well without
immediate post procedural complication.
IMPRESSION: Successful placement of 23 cm tip to cuff tunneled hemodialysis
catheter via the right internal jugular vein with tips terminating
within the superior aspect of the right atrium. The catheter is
ready for immediate use.

## 2020-10-25 NOTE — Progress Notes (Deleted)
{Choose 1 Note Type (Video or Telephone):[phone]}    Date:  10/25/2020   ID:  Edward Thomas, DOB 03/15/1991, MRN [id number] The patient was identified using 2 identifiers.  {Patient Location:[phone]::"Home"} {Provider Location:[phone]::"Home Office"}  PCP:  Patient, No Pcp Per  Cardiologist:  Kate Sable, MD (Inactive) *** Electrophysiologist:  None   Evaluation Performed:  {Choose Visit XFGH:[id number]::"VELFYB-OF Visit"}  Chief Complaint:  ***  History of Present Illness:    Edward Thomas is a 29 y.o. male with Hypertensive cardiomyopathy and ESRD on HD. Last echo 11/25/18 LVEF 75-10%,CHENID diastolic function, mild left ventricular dilatation, moderate LVH, and mild left atrial dilatation.EF improved from 20% in 2019.   Last telemedicine visit 02/2020 was doing well.   The patient {does/does not:200015} have symptoms concerning for COVID-19 infection (fever, chills, cough, or new shortness of breath).    Past Medical History:  Diagnosis Date  . Cardiomyopathy ([place])    a. 07/2018: EF reduced to 20% - thought to be due to uncontrolled HTN  . ESRD (end stage renal disease) ([place])   . Essential hypertension    Past Surgical History:  Procedure Laterality Date  . IR FLUORO GUIDE CV LINE RIGHT  07/30/2018  . IR US GUIDE VASC ACCESS RIGHT  07/30/2018     No outpatient medications have been marked as taking for the 11/08/20 encounter (Appointment) with Imogene Burn, PA-C.     Allergies:   Patient has no known allergies.   Social History   Tobacco Use  . Smoking status: Former Research scientist (life sciences)  . Smokeless tobacco: Never Used  Vaping Use  . Vaping Use: Never used  Substance Use Topics  . Alcohol use: Never  . Drug use: Never     Family Hx: The patient's family history includes Hypertension in his mother.  ROS:   Please see the history of present illness.    *** All other systems reviewed and are negative.   Prior CV studies:   The following  studies were reviewed today:  Echo 11/2018  Labs/Other Tests and Data Reviewed:    EKG:  {EKG/Telemetry Strips Reviewed:[phone]}  Recent Labs: No results found for requested labs within last 8760 hours.   Recent Lipid Panel No results found for: CHOL, TRIG, HDL, CHOLHDL, LDLCALC, LDLDIRECT  Wt Readings from Last 3 Encounters:  03/03/20 211 lb 12.8 oz (96.1 kg)  07/18/19 196 lb (88.9 kg)  09/19/18 185 lb (83.9 kg)     Risk Assessment/Calculations:   {Does this patient have ATRIAL FIBRILLATION?:[phone]}  Objective:    Vital Signs:  There were no vitals taken for this visit.   {HeartCare Virtual Exam (Optional):[id number]::"VITAL SIGNS:  reviewed"}  ASSESSMENT & PLAN:    Hypertensive cardiomyopathy LVEF improved 45-50% on echo 2020  HTN  ESRD  On HD   {Are you ordering a CV Procedure (e.g. stress test, cath, DCCV, TEE, etc)?   Press F2        :[id number]}    COVID-19 Education: The signs and symptoms of COVID-19 were discussed with the patient and how to seek care for testing (follow up with PCP or arrange E-visit).  ***The importance of social distancing was discussed today.  Time:   Today, I have spent *** minutes with the patient with telehealth technology discussing the above problems.     Medication Adjustments/Labs and Tests Ordered: Current medicines are reviewed at length with the patient today.  Concerns regarding medicines are outlined above.   Tests Ordered: No orders of the defined types  were placed in this encounter.   Medication Changes: No orders of the defined types were placed in this encounter.   Follow Up:  {F/U [phone]} {follow up:15908}  Signed, Ermalinda Barrios, PA-C  10/25/2020 8:49 AM    [facility]

## 2020-11-08 ENCOUNTER — Telehealth: Payer: Medicaid Other | Admitting: Physician Assistant

## 2020-11-10 NOTE — Progress Notes (Signed)
Virtual Visit via Telephone Note   This visit type was conducted due to national recommendations for restrictions regarding the COVID-19 Pandemic (e.g. social distancing) in an effort to limit this patient's exposure and mitigate transmission in our community.  Due to his co-morbid illnesses, this patient is at least at moderate risk for complications without adequate follow up.  This format is felt to be most appropriate for this patient at this time.  The patient did not have access to video technology/had technical difficulties with video requiring transitioning to audio format only (telephone).  All issues noted in this document were discussed and addressed.  No physical exam could be performed with this format.  Please refer to the patient's chart for his  consent to telehealth for [facility].    Date:  11/17/2020   ID:  Berenice Bouton, DOB 11-Feb-1991, MRN [id number] The patient was identified using 2 identifiers.  Patient Location: Other:  Dialysis Provider Location: Office/Clinic  PCP:  Patient, No Pcp Per  Cardiologist:  Kate Sable, MD (Inactive)   Electrophysiologist:  None   Evaluation Performed:  Follow-Up Visit  Chief Complaint: follow up  History of Present Illness:    CALAHAN PAK is a 30 y.o. male with with history of hypertensive cardiomyopathy LVEF 45 to 50% on echo 11/25/2018 improved from 20%, hypertension, ESRD on HD.  Had a telemedicine visit 03/03/2020 at which time he was stable.  Patient's visit is assisted by her cousin Maudie Mercury. Walks about a mile 4 days. Denies chest pain, dyspnea, edema.    The patient does not have symptoms concerning for COVID-19 infection (fever, chills, cough, or new shortness of breath).    Past Medical History:  Diagnosis Date  . Cardiomyopathy ([place])    a. 07/2018: EF reduced to 20% - thought to be due to uncontrolled HTN  . ESRD (end stage renal disease) ([place])   . Essential hypertension    Past Surgical  History:  Procedure Laterality Date  . IR FLUORO GUIDE CV LINE RIGHT  07/30/2018  . IR US GUIDE VASC ACCESS RIGHT  07/30/2018     Current Meds  Medication Sig  . amLODipine (NORVASC) 10 MG tablet Take 1 tablet by mouth once daily  . carvedilol (COREG) 25 MG tablet TAKE 1 TABLET BY MOUTH TWICE DAILY WITH MEALS  . doxazosin (CARDURA) 2 MG tablet Take 1 tablet (2 mg total) by mouth daily.  . furosemide (LASIX) 80 MG tablet Take 1 tablet by mouth once daily  . hydrALAZINE (APRESOLINE) 100 MG tablet TAKE 1 TABLET BY MOUTH THREE TIMES DAILY  . isosorbide mononitrate (IMDUR) 60 MG 24 hr tablet Take 1 tablet by mouth once daily  . sevelamer carbonate (RENVELA) 800 MG tablet Take 1 tablet (800 mg total) by mouth 3 (three) times daily with meals.     Allergies:   Patient has no known allergies.   Social History   Tobacco Use  . Smoking status: Former Research scientist (life sciences)  . Smokeless tobacco: Never Used  Vaping Use  . Vaping Use: Never used  Substance Use Topics  . Alcohol use: Never  . Drug use: Never     Family Hx: The patient's family history includes Hypertension in his mother.  ROS:   Please see the history of present illness.      All other systems reviewed and are negative.   Prior CV studies:   The following studies were reviewed today:  2D echo 11/25/2018 Study Conclusions   - Left ventricle:  The cavity size was mildly dilated. Wall    thickness was increased in a pattern of moderate LVH. Systolic    function was mildly reduced. The estimated ejection fraction was    in the range of 45% to 50%. Diffuse hypokinesis. Left ventricular    diastolic function parameters were normal.  - Aortic valve: Sclerosis without stenosis. There was trivial    regurgitation.  - Left atrium: The atrium was mildly dilated.  - Atrial septum: No defect or patent foramen ovale was identified.   -------------------------------------------------------------------  Study data:  Comparison was made to the  study of 07/26/2018.  Study  status:  Routine.  Procedure:  The patient reported no pain pre or  post test. Transthoracic echocardiography. Image quality was  adequate.  Study completion:  There were no complications.  Transthoracic echocardiography.  M-mode, complete 2D, spectral  Doppler, and color Doppler.  Birthdate:  Patient birthdate:  1990/12/04.  Age:  Patient is 30 yr old.  Sex:  Gender: male.  BMI: 25.1 kg/m^2.  Blood pressure:     157/91  Patient status:  Inpatient.  Study date:  Study date: 11/25/2018. Study time: 02:58  PM.  Location:  Echo laboratory.      Labs/Other Tests and Data Reviewed:    EKG:  No ECG reviewed.  Recent Labs: No results found for requested labs within last 8760 hours.   Recent Lipid Panel No results found for: CHOL, TRIG, HDL, CHOLHDL, LDLCALC, LDLDIRECT  Wt Readings from Last 3 Encounters:  03/03/20 211 lb 12.8 oz (96.1 kg)  07/18/19 196 lb (88.9 kg)  09/19/18 185 lb (83.9 kg)     Risk Assessment/Calculations:      Objective:    Vital Signs:  BP (!) 163/87   Ht 6' (1.829 m)   BMI 28.73 kg/m    VITAL SIGNS:  reviewed  ASSESSMENT & PLAN:    Hypertensive cardiomyopathy LVEF 45 to 50% on echo 11/25/2018  Hypertension BP up today but getting dialysis and hasn't taken meds. No changes.  ESRD on hemodialysis-has been evaluated by [facility] team- on hold with [phone]         COVID-19 Education: The signs and symptoms of COVID-19 were discussed with the patient and how to seek care for testing (follow up with PCP or arrange E-visit).   The importance of social distancing was discussed today.  Time:   Today, I have spent 5:50 minutes with the patient with telehealth technology discussing the above problems.     Medication Adjustments/Labs and Tests Ordered: Current medicines are reviewed at length with the patient today.  Concerns regarding medicines are outlined above.   Tests Ordered: No orders of the defined types  were placed in this encounter.   Medication Changes: No orders of the defined types were placed in this encounter.   Follow Up:  Virtual Visit  in 6 month(s) Dr. Domenic Polite or Dr. Harl Bowie  Signed, Ermalinda Barrios, PA-C  11/17/2020 8:57 AM    [place]

## 2020-11-17 ENCOUNTER — Other Ambulatory Visit: Payer: Self-pay

## 2020-11-17 ENCOUNTER — Encounter: Payer: Self-pay | Admitting: Physician Assistant

## 2020-11-17 ENCOUNTER — Telehealth (INDEPENDENT_AMBULATORY_CARE_PROVIDER_SITE_OTHER): Payer: Medicaid Other | Admitting: Physician Assistant

## 2020-11-17 VITALS — BP 163/87 | Ht 72.0 in

## 2020-11-17 DIAGNOSIS — N186 End stage renal disease: Secondary | ICD-10-CM

## 2020-11-17 DIAGNOSIS — I43 Cardiomyopathy in diseases classified elsewhere: Secondary | ICD-10-CM | POA: Diagnosis not present

## 2020-11-17 DIAGNOSIS — I10 Essential (primary) hypertension: Secondary | ICD-10-CM | POA: Diagnosis not present

## 2020-11-17 DIAGNOSIS — I110 Hypertensive heart disease with heart failure: Secondary | ICD-10-CM

## 2020-11-17 DIAGNOSIS — Z992 Dependence on renal dialysis: Secondary | ICD-10-CM

## 2020-11-17 NOTE — Patient Instructions (Signed)
Medication Instructions:  Your physician recommends that you continue on your current medications as directed. Please refer to the Current Medication list given to you today. *If you need a refill on your cardiac medications before your next appointment, please call your pharmacy*   Lab Work: None If you have labs (blood work) drawn today and your tests are completely normal, you will receive your results only by: Marland Kitchen MyChart Message (if you have MyChart) OR . A paper copy in the mail If you have any lab test that is abnormal or we need to change your treatment, we will call you to review the results.   Testing/Procedures: None   Follow-Up: At [facility], you and your health needs are our priority.  As part of our continuing mission to provide you with exceptional heart care, we have created designated Provider Care Teams.  These Care Teams include your primary Cardiologist (physician) and Advanced Practice Providers (APPs -  Physician Assistants and Nurse Practitioners) who all work together to provide you with the care you need, when you need it.    Your next appointment:   6 month(s)  The format for your next appointment:   In Person  Provider:   Dr Domenic Polite or Dr Harl Bowie
